# Patient Record
Sex: Female | Born: 1975 | Race: White | Hispanic: No | Marital: Married | Smoking: Former smoker
Health system: Southern US, Community
[De-identification: ages and names within clinical notes are randomized; demographics above are authoritative.]

## PROBLEM LIST (undated history)

## (undated) ENCOUNTER — Inpatient Hospital Stay (HOSPITAL_COMMUNITY): Payer: Self-pay

## (undated) DIAGNOSIS — R51 Headache: Secondary | ICD-10-CM

## (undated) DIAGNOSIS — M549 Dorsalgia, unspecified: Secondary | ICD-10-CM

## (undated) DIAGNOSIS — K219 Gastro-esophageal reflux disease without esophagitis: Secondary | ICD-10-CM

## (undated) DIAGNOSIS — R519 Headache, unspecified: Secondary | ICD-10-CM

## (undated) DIAGNOSIS — S0990XA Unspecified injury of head, initial encounter: Secondary | ICD-10-CM

## (undated) HISTORY — PX: COLPOSCOPY: SHX161

## (undated) HISTORY — PX: FACIAL RECONSTRUCTION SURGERY: SHX631

## (undated) HISTORY — DX: Headache, unspecified: R51.9

## (undated) HISTORY — DX: Headache: R51

## (undated) HISTORY — PX: WISDOM TOOTH EXTRACTION: SHX21

---

## 1997-12-23 ENCOUNTER — Emergency Department (HOSPITAL_COMMUNITY): Admission: EM | Admit: 1997-12-23 | Discharge: 1997-12-23 | Payer: Self-pay | Admitting: Emergency Medicine

## 1998-04-22 ENCOUNTER — Emergency Department (HOSPITAL_COMMUNITY): Admission: EM | Admit: 1998-04-22 | Discharge: 1998-04-22 | Payer: Self-pay | Admitting: Emergency Medicine

## 1998-10-05 ENCOUNTER — Emergency Department (HOSPITAL_COMMUNITY): Admission: EM | Admit: 1998-10-05 | Discharge: 1998-10-05 | Payer: Self-pay | Admitting: Emergency Medicine

## 1998-11-17 ENCOUNTER — Encounter: Admission: RE | Admit: 1998-11-17 | Discharge: 1998-11-17 | Payer: Self-pay | Admitting: Obstetrics

## 1999-02-07 ENCOUNTER — Encounter: Admission: RE | Admit: 1999-02-07 | Discharge: 1999-02-07 | Payer: Self-pay | Admitting: Obstetrics & Gynecology

## 1999-05-05 ENCOUNTER — Encounter: Admission: RE | Admit: 1999-05-05 | Discharge: 1999-05-05 | Payer: Self-pay | Admitting: Obstetrics & Gynecology

## 1999-07-25 ENCOUNTER — Encounter: Admission: RE | Admit: 1999-07-25 | Discharge: 1999-07-25 | Payer: Self-pay | Admitting: Obstetrics

## 2001-11-11 ENCOUNTER — Other Ambulatory Visit: Admission: RE | Admit: 2001-11-11 | Discharge: 2001-11-11 | Payer: Self-pay | Admitting: Obstetrics and Gynecology

## 2001-11-11 ENCOUNTER — Other Ambulatory Visit: Admission: RE | Admit: 2001-11-11 | Discharge: 2001-11-11 | Payer: Self-pay | Admitting: Obstetrics & Gynecology

## 2002-04-21 ENCOUNTER — Other Ambulatory Visit: Admission: RE | Admit: 2002-04-21 | Discharge: 2002-04-21 | Payer: Self-pay | Admitting: Obstetrics and Gynecology

## 2002-06-19 ENCOUNTER — Inpatient Hospital Stay (HOSPITAL_COMMUNITY): Admission: AD | Admit: 2002-06-19 | Discharge: 2002-06-22 | Payer: Self-pay | Admitting: Obstetrics and Gynecology

## 2002-06-23 ENCOUNTER — Encounter: Admission: RE | Admit: 2002-06-23 | Discharge: 2002-07-23 | Payer: Self-pay | Admitting: Obstetrics and Gynecology

## 2002-07-13 ENCOUNTER — Other Ambulatory Visit: Admission: RE | Admit: 2002-07-13 | Discharge: 2002-07-13 | Payer: Self-pay | Admitting: Obstetrics and Gynecology

## 2002-07-24 ENCOUNTER — Encounter: Admission: RE | Admit: 2002-07-24 | Discharge: 2002-08-23 | Payer: Self-pay | Admitting: Obstetrics and Gynecology

## 2002-10-09 ENCOUNTER — Other Ambulatory Visit: Admission: RE | Admit: 2002-10-09 | Discharge: 2002-10-09 | Payer: Self-pay | Admitting: Obstetrics and Gynecology

## 2003-06-17 ENCOUNTER — Other Ambulatory Visit: Admission: RE | Admit: 2003-06-17 | Discharge: 2003-06-17 | Payer: Self-pay | Admitting: Obstetrics and Gynecology

## 2004-06-23 ENCOUNTER — Other Ambulatory Visit: Admission: RE | Admit: 2004-06-23 | Discharge: 2004-06-23 | Payer: Self-pay | Admitting: Obstetrics and Gynecology

## 2009-11-16 ENCOUNTER — Encounter (INDEPENDENT_AMBULATORY_CARE_PROVIDER_SITE_OTHER): Payer: Self-pay | Admitting: *Deleted

## 2009-12-30 ENCOUNTER — Ambulatory Visit: Payer: Self-pay | Admitting: Gastroenterology

## 2009-12-30 ENCOUNTER — Encounter (INDEPENDENT_AMBULATORY_CARE_PROVIDER_SITE_OTHER): Payer: Self-pay | Admitting: *Deleted

## 2009-12-30 DIAGNOSIS — K921 Melena: Secondary | ICD-10-CM

## 2010-01-09 ENCOUNTER — Telehealth: Payer: Self-pay | Admitting: Gastroenterology

## 2010-02-27 ENCOUNTER — Encounter: Payer: Self-pay | Admitting: Gastroenterology

## 2010-02-27 ENCOUNTER — Ambulatory Visit
Admission: RE | Admit: 2010-02-27 | Discharge: 2010-02-27 | Payer: Self-pay | Source: Home / Self Care | Attending: Gastroenterology | Admitting: Gastroenterology

## 2010-03-21 NOTE — Letter (Signed)
Summary: New Patient letter  [facility]  [address] [place], [place] [address]   Phone: [phone]  Fax: [phone]       11/16/2009 MRN: [id number]  [facility] [place] [address] [place], [place]  [address]  Dear Ms. PRESTON,  Welcome to the Gastroenterology Division at [facility].    You are scheduled to see Dr. Christella Hartigan on 12/30/2009 at 9:00AM on the 3rd floor at [facility], [address]  We ask that you try to arrive at our office 15 minutes prior to your appointment time to allow for check-in.  We would like you to complete the enclosed self-administered evaluation form prior to your visit and bring it with you on the day of your appointment.  We will review it with you.  Also, please bring a complete list of all your medications or, if you prefer, bring the medication bottles and we will list them.  Please bring your insurance card so that we may make a copy of it.  If your insurance requires a referral to see a specialist, please bring your referral form from your primary care physician.  Co-payments are due at the time of your visit and may be paid by cash, check or credit card.     Your office visit will consist of a consult with your physician (includes a physical exam), any laboratory testing he/she may order, scheduling of any necessary diagnostic testing (e.g. x-ray, ultrasound, CT-scan), and scheduling of a procedure (e.g. Endoscopy, Colonoscopy) if required.  Please allow enough time on your schedule to allow for any/all of these possibilities.    If you cannot keep your appointment, please call [phone] to cancel or reschedule prior to your appointment date.  This allows [place] the opportunity to schedule an appointment for another patient in need of care.  If you do not cancel or reschedule by 5 p.m. the business day prior to your appointment date, you will be charged a $50.00 late cancellation/no-show fee.    Thank you for  choosing [facility] for your medical needs.  We appreciate the opportunity to care for you.  Please visit [place] at our website  to learn more about our practice.                     Sincerely,                                                             The Gastroenterology Division

## 2010-03-21 NOTE — Progress Notes (Signed)
Summary: prep concern  Phone Note Call from Patient Call back at Home Phone [phone] Call back at [phone]   Caller: Patient Call For: Dr Christella Hartigan Reason for Call: Talk to Nurse Complaint: Nausea/Vomiting/Diarrhea Summary of Call: would like a more affordable prep  Initial call taken by: Vallarie Mare,  January 09, 2010 12:57 PM  Follow-up for Phone Call        no answer when called back, left patient message to call me back. Follow-up by: Sherren Kerns RN,  January 09, 2010 1:52 PM  Additional Follow-up for Phone Call Additional follow up Details #1::        Spoke with patient offered $20 mail in rebate,she agreed. I will mail this to her,she understands. also patient wants the [facility] Sent at this time. Additional Follow-up by: Sherren Kerns RN,  January 10, 2010 8:45 AM    New/Updated Medications: MOVIPREP 100 GM  SOLR (PEG-KCL-NACL-NASULF-NA ASC-C) As per prep instructions. Prescriptions: MOVIPREP 100 GM  SOLR (PEG-KCL-NACL-NASULF-NA ASC-C) As per prep instructions.  #1 x 0   Entered by:   Sherren Kerns RN   Authorized by:   Rachael Fee MD   Signed by:   Sherren Kerns RN on 01/10/2010   Method used:   Electronically to        [facility] [id number]* (retail)       [address]       [place], [place]  [address]       Ph: [phone]       Fax: [phone]   RxID:   [id number]

## 2010-03-21 NOTE — Assessment & Plan Note (Signed)
History of Present Illness Primary GI MD: Rob Bunting MD Primary Provider: Alinda Sierras, MD Requesting Provider: Carrington Clamp, MD Chief Complaint: Pos heme stools seen on last physical exam. Mother with hx of colon caner. History of Present Illness:      very pleasant 35 year old woman who had routine gyne exam, she explained her family history of colon cancer and so sent in stool samples that were +FOBT.  her mother was diagnosed with colon cancer in her early 65's.  Caught early,  ?surgery, then a "chemo pill."  Mom also had breast cancer 2009, double mastectomy/chem/xrt.  When she was 7 years go, she was told she had ulcers in her stomach.   was put on meds as needed (??name).  no reall issues with her bowels unless she eats too much [place] food (causes cramping/diarrhea).  Weight is up 20 pounds in past year.           Current Medications (verified): 1)  Nuvaring 0.12-0.015 Mg/24hr Ring (Etonogestrel-Ethinyl Estradiol) .... As Directed 2)  Clarinex 5 Mg Tabs (Desloratadine) .... As Needed 3)  Lipozene 1500mg  .... One Tablet By Mouth Three Times A Day  Allergies (verified): 1)  ! Penicillin 2)  ! 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  All other review of systems was otherwise negative.   Vital Signs:  Patient profile:   35 year old female Height:      61.5 inches Weight:      220.50 pounds BMI:     41.14 Pulse rate:   88 /  minute Pulse rhythm:   regular BP sitting:   132 / 78  (right arm) Cuff size:   regular  Vitals Entered By: Christie Nottingham CMA Duncan Dull) (December 30, 2009 8:49 AM)  Physical Exam  Additional Exam:  Constitutional: Obese, otherwise generally well appearing Psychiatric: alert and oriented times 3 Eyes: extraocular movements intact Mouth: oropharynx moist, no lesions Neck: supple, no lymphadenopathy Cardiovascular: heart regular rate and rythm Lungs: CTA bilaterally Abdomen: soft, non-tender, non-distended, no obvious ascites, no peritoneal signs, normal bowel sounds Extremities: no lower extremity edema bilaterally Skin: no lesions on visible extremities    Impression & Recommendations:  Problem # 1:  FOBT positive, family history of colon cancer she is otherwise asymptomatic and we will proceed with full colonoscopy at her soonest convenience. She understands that given her father's history of colon cancer she will likely need colonoscopy every 5 years even if no polyps are found.  Problem # 2:  obesity we spent several minutes talking about her obesity, weight loss plans. It seems like she buys a lot of products that are sold in infomercials. I explained to her that the best, long-term we plan is a small reduction in her overall calorie intake that she can continue indefinitely.  Problem # 3:   smoker phone numbers, information given to help her quit  Patient Instructions: 1)  One of your biggest  health concerns is your smoking.  You should try your absolute best to stop.  If you need assistence, please contact your PCP or Smoking Cessation Class at [facility] [phone]) or [facility] Quit-Line (1-800-QUIT-NOW). 2)  You will be scheduled to have a colonoscopy. 3)  Slow gradual weight loss over many years (aim for one pound a month...over many years this will make a huge difference). 4)  A copy of this information will be sent to Dr. Henderson Cloud. 5)  The medication list was  reviewed and reconciled.  All changed / newly prescribed medications were explained.  A complete medication list was provided to the patient / caregiver.  Appended Document: Orders Update/movi    Clinical Lists Changes  Problems: Added new problem of BLOOD IN STOOL (ICD-578.1) Medications: Added new medication of MOVIPREP 100 GM  SOLR (PEG-KCL-NACL-NASULF-NA ASC-C) As per prep instructions. - Signed Rx of MOVIPREP 100 GM  SOLR (PEG-KCL-NACL-NASULF-NA ASC-C) As per prep instructions.;  #1 x 0;  Signed;  Entered by: Chales Abrahams CMA (AAMA);  Authorized by: Rachael Fee MD;  Method used: Electronically to [facility] [phone]*, [address] [place], [place]  [address], Ph: [phone] or [phone], Fax: [phone] Orders: Added new Test order of Colonoscopy (Colon) - Signed    Prescriptions: MOVIPREP 100 GM  SOLR (PEG-KCL-NACL-NASULF-NA ASC-C) As per prep instructions.  #1 x 0   Entered by:   Chales Abrahams CMA (AAMA)   Authorized by:   Rachael Fee MD   Signed by:   Chales Abrahams CMA (AAMA) on 12/30/2009   Method used:   [facility] [phone][facility] (retail)       [address]       [place], [place]  [address]       Ph: [phone] or [phone]       Fax: [phone]   RxID:   [id number]

## 2010-03-21 NOTE — Letter (Signed)
Summary: [facility] Instructions  [facility]  [address] [place], [place] [address]   Phone: [phone]  Fax: [phone]       UNNAMED Jennifer Miranda    [birth year]    MRN: [id number]        Procedure Day /Date:02/27/10 MON     Arrival Time:930 am     Procedure Time:1030 am     Location of Procedure:                    X  [facility] (4th Floor)                        PREPARATION FOR COLONOSCOPY WITH MOVIPREP   Starting 5 days prior to your procedure 02/22/2010 do not eat nuts, seeds, popcorn, corn, beans, peas,  salads, or any raw vegetables.  Do not take any fiber supplements (e.g. Metamucil, Citrucel, and Benefiber).  THE DAY BEFORE YOUR PROCEDURE         DATE: 02/26/10  DAY: SUN  1.  Drink clear liquids the entire day-NO SOLID FOOD  2.  Do not drink anything colored red or purple.  Avoid juices with pulp.  No orange juice.  3.  Drink at least 64 oz. (8 glasses) of fluid/clear liquids during the day to prevent dehydration and help the prep work efficiently.  CLEAR LIQUIDS INCLUDE: Water Jello Ice Popsicles Tea (sugar ok, no milk/cream) Powdered fruit flavored drinks Coffee (sugar ok, no milk/cream) Gatorade Juice: apple, white grape, white cranberry  Lemonade Clear bullion, consomm, broth Carbonated beverages (any kind) Strained chicken noodle soup Hard Candy                             4.  In the morning, mix first dose of MoviPrep solution:    Empty 1 Pouch A and 1 Pouch B into the disposable container    Add lukewarm drinking water to the top line of the container. Mix to dissolve    Refrigerate (mixed solution should be used within 24 hrs)  5.  Begin drinking the prep at 5:00 p.m. The MoviPrep container is divided by 4 marks.   Every 15 minutes drink the solution down to the next mark (approximately 8 oz) until the full liter is complete.   6.  Follow completed prep with 16 oz of clear liquid of your choice (Nothing red or purple).   Continue to drink clear liquids until bedtime.  7.  Before going to bed, mix second dose of MoviPrep solution:    Empty 1 Pouch A and 1 Pouch B into the disposable container    Add lukewarm drinking water to the top line of the container. Mix to dissolve    Refrigerate  THE DAY OF YOUR PROCEDURE      DATE: 02/27/10 DAY: MON  Beginning at 530 a.m. (5 hours before procedure):         1. Every 15 minutes, drink the solution down to the next mark (approx 8 oz) until the full liter is complete.  2. Follow completed prep with 16 oz. of clear liquid of your choice.    3. You may drink clear liquids until 830 am (2 HOURS BEFORE PROCEDURE).   MEDICATION INSTRUCTIONS  Unless otherwise instructed, you should take regular prescription medications with a small sip of water   as early as possible the morning of your procedure.  OTHER INSTRUCTIONS  You will need a responsible adult at least 33 years of age to accompany you and drive you home.   This person must remain in the waiting room during your procedure.  Wear loose fitting clothing that is easily removed.  Leave jewelry and other valuables at home.  However, you may wish to bring a book to read or  an iPod/MP3 player to listen to music as you wait for your procedure to start.  Remove all body piercing jewelry and leave at home.  Total time from sign-in until discharge is approximately 2-3 hours.  You should go home directly after your procedure and rest.  You can resume normal activities the  day after your procedure.  The day of your procedure you should not:   Drive   Make legal decisions   Operate machinery   Drink alcohol   Return to work  You will receive specific instructions about eating, activities and medications before you leave.    The above instructions have been reviewed and explained to me by   _______________________    I fully understand and can verbalize these instructions  _____________________________ Date _________

## 2010-03-23 NOTE — Procedures (Signed)
Summary: Colonoscopy  Patient: Jennifer Miranda Note: All result statuses are Final unless otherwise noted.  Tests: (1) Colonoscopy (COL)   COL Colonoscopy           DONE     [facility]     [address]     [place], [place]  [address]           COLONOSCOPY PROCEDURE REPORT           PATIENT:  Jennifer, Miranda  MR#:  [id number]     BIRTHDATE:  1975-06-01, 34 yrs. old  GENDER:  female     ENDOSCOPIST:  Rachael Fee, MD     REF. BY:  Carrington Clamp, M.D.     PROCEDURE DATE:  02/27/2010     PROCEDURE:  Diagnostic Colonoscopy     ASA CLASS:  Class II     INDICATIONS:  FOBT positive stool, mother had colon cancer (in her     74s)     MEDICATIONS:   Fentanyl 100 mcg IV, Versed 9 mg IV           DESCRIPTION OF PROCEDURE:   After the risks benefits and     alternatives of the procedure were thoroughly explained, informed     consent was obtained.  Digital rectal exam was performed and     revealed no rectal masses.   The LB PCF-H180AL [id number] endoscope     was introduced through the anus and advanced to the cecum, which     was identified by both the appendix and ileocecal valve, without     limitations.  The quality of the prep was good, using MoviPrep.     The instrument was then slowly withdrawn as the colon was fully     examined.     <<PROCEDUREIMAGES>>     FINDINGS:  A normal appearing cecum, ileocecal valve, and     appendiceal orifice were identified. The ascending, hepatic     flexure, transverse, splenic flexure, descending, sigmoid colon,     and rectum appeared unremarkable (see image1, image2, and image3).     Retroflexed views in the rectum revealed no abnormalities.    The     scope was then withdrawn from the patient and the procedure     completed.     COMPLICATIONS:  None           ENDOSCOPIC IMPRESSION:     1) Normal colon     2) No polyps or cancers           RECOMMENDATIONS:     1) Given your significant family history of colon cancer, you   should have a repeat colonoscopy in 5 years           REPEAT EXAM:  5 years           ______________________________     Rachael Fee, MD           n.     eSIGNED:   Rachael Fee at 02/27/2010 10:54 AM           Eustace Quail, [id number]  Note: An exclamation mark (!) indicates a result that was not dispersed into the flowsheet. Document Creation Date: 02/27/2010 10:55 AM _______________________________________________________________________  (1) Order result status: Final Collection or observation date-time: 02/27/2010 10:49 Requested date-time:  Receipt date-time:  Reported date-time:  Referring Physician:   Ordering Physician: Rob Bunting [phone]) Specimen Source:  Source: Launa Grill Order Number: [id number]  Lab site:   Appended Document: Colonoscopy    Clinical Lists Changes  Observations: Added new observation of COLONNXTDUE: 02/2015 (02/27/2010 11:36)

## 2010-11-16 ENCOUNTER — Emergency Department (HOSPITAL_COMMUNITY)
Admission: EM | Admit: 2010-11-16 | Discharge: 2010-11-16 | Disposition: A | Discharge status: Home / Self Care | Payer: 59 | Attending: Emergency Medicine | Admitting: Emergency Medicine

## 2010-11-16 DIAGNOSIS — H539 Unspecified visual disturbance: Secondary | ICD-10-CM | POA: Insufficient documentation

## 2010-11-16 DIAGNOSIS — R112 Nausea with vomiting, unspecified: Secondary | ICD-10-CM | POA: Insufficient documentation

## 2015-05-12 ENCOUNTER — Encounter: Payer: Self-pay | Admitting: Gastroenterology

## 2015-06-20 ENCOUNTER — Encounter: Payer: Self-pay | Admitting: Gastroenterology

## 2017-02-19 NOTE — L&D Delivery Note (Signed)
Operative Delivery Note Patient pushed well for 20 minutes.  At 7:34 PM a viable female was delivered via Vaginal, Spontaneous.  Presentation: vertex; Position: direct OA.  After delivery of the fetal head, the fetal head did not restitute.  Nuchal cord x 1 was reduced.  Anterior left shoulder did not delivery easily with usual gentle traction.  Shoulder dystocia was identified and additional help requested.   Mom was instructed to stop pushing.  McRoberts/ suprapubic pressure applied.  Posterior arm attempted but unable to be delivered.  Woodscrew maneuver was employed, which allowed the anterior should to then deliver with the usual traction.  Total time for shoulder dystocia was 90 seconds.  Following delivery, the cord was immediately cut and clamped and passed to the warm for the neonatology team.  After evaluation by NICU team, baby was doing skin to skin with mom  Delivery of the head: 02/10/2018  7:32 PM First maneuver: 02/10/2018  7:32 PM, McRoberts Suprapubic Pressure Second maneuver: 02/10/2018  7:33 PM, Attempted posterior arm, unable to deliver  Third maneuver: 02/10/2018  7:34 PM,  Woodscrew   APGAR: 2, 9; weight 9lb 1.7oz (4130gm)   Placenta status: spontaneous, in tact.   Cord: 3V with the following complications:None .  Cord pH: 7.16  Anesthesia:  Epidural Episiotomy: None Lacerations: 2nd degree Suture Repair: 2.0 vicryl rapide Est. Blood Loss (mL):  300 mL  Mom to postpartum.  Baby to Couplet care / Skin to Skin.  Jennifer [name] 02/10/2018, 8:08 PM

## 2017-07-30 LAB — OB RESULTS CONSOLE GC/CHLAMYDIA
CHLAMYDIA, DNA PROBE: NEGATIVE
GC PROBE AMP, GENITAL: NEGATIVE

## 2017-07-30 LAB — OB RESULTS CONSOLE RUBELLA ANTIBODY, IGM: Rubella: IMMUNE

## 2017-07-30 LAB — OB RESULTS CONSOLE HIV ANTIBODY (ROUTINE TESTING): HIV: NONREACTIVE

## 2017-07-30 LAB — OB RESULTS CONSOLE RPR: RPR: NONREACTIVE

## 2017-07-30 LAB — OB RESULTS CONSOLE HEPATITIS B SURFACE ANTIGEN: Hepatitis B Surface Ag: NEGATIVE

## 2018-01-14 ENCOUNTER — Encounter (HOSPITAL_COMMUNITY): Payer: Self-pay | Admitting: *Deleted

## 2018-01-14 ENCOUNTER — Inpatient Hospital Stay (HOSPITAL_COMMUNITY)
Admission: AD | Admit: 2018-01-14 | Discharge: 2018-01-14 | Disposition: A | Discharge status: Home / Self Care | Payer: 59 | Source: Ambulatory Visit | Attending: Obstetrics and Gynecology | Admitting: Obstetrics and Gynecology

## 2018-01-14 DIAGNOSIS — Z3A35 35 weeks gestation of pregnancy: Secondary | ICD-10-CM | POA: Insufficient documentation

## 2018-01-14 DIAGNOSIS — M545 Low back pain: Secondary | ICD-10-CM | POA: Insufficient documentation

## 2018-01-14 DIAGNOSIS — Z91040 Latex allergy status: Secondary | ICD-10-CM | POA: Insufficient documentation

## 2018-01-14 DIAGNOSIS — O09523 Supervision of elderly multigravida, third trimester: Secondary | ICD-10-CM | POA: Diagnosis not present

## 2018-01-14 DIAGNOSIS — Z880 Allergy status to penicillin: Secondary | ICD-10-CM | POA: Insufficient documentation

## 2018-01-14 DIAGNOSIS — M549 Dorsalgia, unspecified: Secondary | ICD-10-CM | POA: Diagnosis not present

## 2018-01-14 DIAGNOSIS — O99891 Other specified diseases and conditions complicating pregnancy: Secondary | ICD-10-CM

## 2018-01-14 DIAGNOSIS — O26893 Other specified pregnancy related conditions, third trimester: Secondary | ICD-10-CM | POA: Diagnosis not present

## 2018-01-14 DIAGNOSIS — O9989 Other specified diseases and conditions complicating pregnancy, childbirth and the puerperium: Secondary | ICD-10-CM

## 2018-01-14 DIAGNOSIS — R109 Unspecified abdominal pain: Secondary | ICD-10-CM | POA: Insufficient documentation

## 2018-01-14 HISTORY — DX: Unspecified injury of head, initial encounter: S09.90XA

## 2018-01-14 LAB — OB RESULTS CONSOLE GC/CHLAMYDIA: Gonorrhea: NEGATIVE

## 2018-01-14 LAB — URINALYSIS, ROUTINE W REFLEX MICROSCOPIC
Bilirubin Urine: NEGATIVE
GLUCOSE, UA: NEGATIVE mg/dL
Hgb urine dipstick: NEGATIVE
Ketones, ur: NEGATIVE mg/dL
Nitrite: NEGATIVE
PH: 6 (ref 5.0–8.0)
Protein, ur: NEGATIVE mg/dL
Specific Gravity, Urine: 1.004 — ABNORMAL LOW (ref 1.005–1.030)

## 2018-01-14 LAB — WET PREP, GENITAL
CLUE CELLS WET PREP: NONE SEEN
SPERM: NONE SEEN
TRICH WET PREP: NONE SEEN
Yeast Wet Prep HPF POC: NONE SEEN

## 2018-01-14 MED ORDER — LACTATED RINGERS IV BOLUS
1000.0000 mL | Freq: Once | INTRAVENOUS | Status: AC
  Administered 2018-01-14: 1000 mL via INTRAVENOUS

## 2018-01-14 MED ORDER — CYCLOBENZAPRINE HCL 10 MG PO TABS
10.0000 mg | ORAL_TABLET | Freq: Once | ORAL | Status: AC
  Administered 2018-01-14: 10 mg via ORAL
  Filled 2018-01-14: qty 1

## 2018-01-14 MED ORDER — CYCLOBENZAPRINE HCL 10 MG PO TABS: 10.0000 mg | ORAL_TABLET | Freq: Two times a day (BID) | ORAL | 0 refills | Status: DC | PRN

## 2018-01-14 NOTE — Discharge Instructions (Signed)

## 2018-01-14 NOTE — MAU Provider Note (Signed)
History     CSN: [id number]  Arrival date and time: 01/14/18 1444   First Provider Initiated Contact with Patient 01/14/18 1534      Chief Complaint  Patient presents with  . Abdominal Pain  . Back Pain   Jennifer Miranda is a 42 y.o. G2P1001 at [redacted]w[redacted]d who presents today with back pain, abdominal pain and watery discharge. She states that the back pain started when she woke up today. The abdominal pain started around 1245 this afternoon and she has had the watery discharge off and on since about 1000 today. She reports normal fetal movement. Next visit 01/21/18   Abdominal Pain  This is a new problem. The current episode started today. The problem occurs intermittently. The problem has been unchanged. The pain is located in the periumbilical region. The pain is at a severity of 6/10. The abdominal pain does not radiate. Associated symptoms include frequency. Pertinent negatives include no dysuria, fever, nausea or vomiting. Nothing aggravates the pain. The pain is relieved by nothing.  Back Pain  This is a new problem. The current episode started today. The problem occurs constantly. The problem is unchanged. The pain is present in the lumbar spine. The pain does not radiate. The pain is at a severity of 6/10. Associated symptoms include abdominal pain. Pertinent negatives include no dysuria or fever. Risk factors include pregnancy and obesity. She has tried nothing for the symptoms.    OB History    Gravida  2   Para  1   Term  1   Preterm      AB      Living  1     SAB      TAB      Ectopic      Multiple      Live Births              Past Medical History:  Diagnosis Date  . Head trauma   . Vaginal Pap smear, abnormal     Past Surgical History:  Procedure Laterality Date  . COLPOSCOPY    . WISDOM TOOTH EXTRACTION      No family history on file.  Social History   Tobacco Use  . Smoking status: Never Smoker  . Smokeless tobacco: Never Used  Substance  Use Topics  . Alcohol use: Never    Frequency: Never  . Drug use: Never    Allergies:  Allergies  Allergen Reactions  . Latex   . Penicillins     No medications prior to admission.    Review of Systems  Constitutional: Negative for chills and fever.  Gastrointestinal: Positive for abdominal pain. Negative for nausea and vomiting.  Genitourinary: Positive for frequency. Negative for dysuria, urgency, vaginal bleeding and vaginal discharge.  Musculoskeletal: Positive for back pain.   Physical Exam   Blood pressure 123/83, pulse (!) 108, temperature 98.2 F (36.8 C), temperature source Oral, resp. rate 17, height 5\' 2"  (1.575 m), weight 112.9 kg, SpO2 100 %.  Physical Exam  Nursing note and vitals reviewed. Constitutional: She is oriented to person, place, and time. She appears well-developed and well-nourished. No distress.  HENT:  Head: Normocephalic.  Cardiovascular: Normal rate.  Respiratory: Effort normal.  GI: Soft. There is no tenderness. There is no rebound.  Genitourinary:  Genitourinary Comments:  External: no lesion Vagina: small amount of white discharge, no pooling  Cervix: pink, smooth, closed/thick Uterus: AGA   Neurological: She is alert and oriented to person, place,  and time.  Skin: Skin is warm and dry.  Psychiatric: She has a normal mood and affect.     Results for orders placed or performed during the hospital encounter of 01/14/18 (from the past 24 hour(s))  Urinalysis, Routine w reflex microscopic     Status: Abnormal   Collection Time: 01/14/18  3:28 PM  Result Value Ref Range   Color, Urine STRAW (A) YELLOW   APPearance CLEAR CLEAR   Specific Gravity, Urine 1.004 (L) 1.005 - 1.030   pH 6.0 5.0 - 8.0   Glucose, UA NEGATIVE NEGATIVE mg/dL   Hgb urine dipstick NEGATIVE NEGATIVE   Bilirubin Urine NEGATIVE NEGATIVE   Ketones, ur NEGATIVE NEGATIVE mg/dL   Protein, ur NEGATIVE NEGATIVE mg/dL   Nitrite NEGATIVE NEGATIVE   Leukocytes, UA  TRACE (A) NEGATIVE   RBC / HPF 0-5 0 - 5 RBC/hpf   WBC, UA 0-5 0 - 5 WBC/hpf   Bacteria, UA MANY (A) NONE SEEN   Squamous Epithelial / LPF 0-5 0 - 5  Wet prep, genital     Status: Abnormal   Collection Time: 01/14/18  3:50 PM  Result Value Ref Range   Yeast Wet Prep HPF POC NONE SEEN NONE SEEN   Trich, Wet Prep NONE SEEN NONE SEEN   Clue Cells Wet Prep HPF POC NONE SEEN NONE SEEN   WBC, Wet Prep HPF POC MODERATE (A) NONE SEEN   Sperm NONE SEEN    NST:  Baseline: 135 Variability: moderate Accels: 15x15 Decels: none Toco: irregular    MAU Course  Procedures  MDM Patient given LR bolus and Flexeril. She reports that her pain has improved.   Assessment and Plan   1. Back pain complicating pregnancy in third trimester   2. [redacted] weeks gestation of pregnancy    DC home Urine culture pending GC/CT pending  Comfort measures reviewed  3rd Trimester precautions  PTL precautions  Fetal kick counts RX: flexeril PRN #20  Return to MAU as needed FU with OB as planned  Follow-up Information    [facility]   Contact information: [address] [place] [address] [phone]            Thressa ShellerHeather Hogan 01/14/2018, 3:38 PM

## 2018-01-14 NOTE — MAU Note (Signed)
Pt reports she has had lower back pain all day, pain is worsening and now she is having pain on right side . Also noticed a watery discharge this afternoon.

## 2018-01-15 LAB — GC/CHLAMYDIA PROBE AMP (~~LOC~~) NOT AT ARMC
CHLAMYDIA, DNA PROBE: NEGATIVE
NEISSERIA GONORRHEA: NEGATIVE

## 2018-01-16 LAB — CULTURE, OB URINE: CULTURE: NO GROWTH

## 2018-01-22 LAB — OB RESULTS CONSOLE GBS: GBS: POSITIVE

## 2018-01-30 ENCOUNTER — Telehealth (HOSPITAL_COMMUNITY): Payer: Self-pay | Admitting: *Deleted

## 2018-01-30 ENCOUNTER — Encounter (HOSPITAL_COMMUNITY): Payer: Self-pay | Admitting: *Deleted

## 2018-01-30 NOTE — Telephone Encounter (Signed)
Preadmission screen  

## 2018-02-10 ENCOUNTER — Encounter (HOSPITAL_COMMUNITY): Payer: Self-pay

## 2018-02-10 ENCOUNTER — Other Ambulatory Visit: Payer: Self-pay

## 2018-02-10 ENCOUNTER — Inpatient Hospital Stay (HOSPITAL_COMMUNITY)
Admission: RE | Admit: 2018-02-10 | Discharge: 2018-02-12 | DRG: 807 | Disposition: A | Discharge status: Home / Self Care | Payer: 59 | Attending: Obstetrics | Admitting: Obstetrics

## 2018-02-10 ENCOUNTER — Inpatient Hospital Stay (HOSPITAL_COMMUNITY): Payer: 59 | Admitting: Anesthesiology

## 2018-02-10 DIAGNOSIS — O26893 Other specified pregnancy related conditions, third trimester: Principal | ICD-10-CM | POA: Diagnosis present

## 2018-02-10 DIAGNOSIS — O09523 Supervision of elderly multigravida, third trimester: Secondary | ICD-10-CM | POA: Diagnosis present

## 2018-02-10 DIAGNOSIS — Z880 Allergy status to penicillin: Secondary | ICD-10-CM

## 2018-02-10 DIAGNOSIS — Z3A39 39 weeks gestation of pregnancy: Secondary | ICD-10-CM | POA: Diagnosis not present

## 2018-02-10 DIAGNOSIS — O99824 Streptococcus B carrier state complicating childbirth: Secondary | ICD-10-CM | POA: Diagnosis present

## 2018-02-10 LAB — CBC
HCT: 34.6 % — ABNORMAL LOW (ref 36.0–46.0)
Hemoglobin: 11 g/dL — ABNORMAL LOW (ref 12.0–15.0)
MCH: 28.1 pg (ref 26.0–34.0)
MCHC: 31.8 g/dL (ref 30.0–36.0)
MCV: 88.3 fL (ref 80.0–100.0)
PLATELETS: 229 10*3/uL (ref 150–400)
RBC: 3.92 MIL/uL (ref 3.87–5.11)
RDW: 14.8 % (ref 11.5–15.5)
WBC: 10.5 10*3/uL (ref 4.0–10.5)
nRBC: 0 % (ref 0.0–0.2)

## 2018-02-10 LAB — ABO/RH: ABO/RH(D): O POS

## 2018-02-10 LAB — TYPE AND SCREEN
ABO/RH(D): O POS
Antibody Screen: NEGATIVE

## 2018-02-10 LAB — RPR: RPR Ser Ql: NONREACTIVE

## 2018-02-10 MED ORDER — PRENATAL MULTIVITAMIN CH
1.0000 | ORAL_TABLET | Freq: Every day | ORAL | Status: DC
  Administered 2018-02-11: 1 via ORAL
  Filled 2018-02-10: qty 1

## 2018-02-10 MED ORDER — TETANUS-DIPHTH-ACELL PERTUSSIS 5-2.5-18.5 LF-MCG/0.5 IM SUSP: 0.5000 mL | Freq: Once | INTRAMUSCULAR | Status: DC

## 2018-02-10 MED ORDER — PHENYLEPHRINE 40 MCG/ML (10ML) SYRINGE FOR IV PUSH (FOR BLOOD PRESSURE SUPPORT)
80.0000 ug | PREFILLED_SYRINGE | INTRAVENOUS | Status: DC | PRN
  Filled 2018-02-10: qty 10

## 2018-02-10 MED ORDER — FAMOTIDINE 20 MG PO TABS
20.0000 mg | ORAL_TABLET | Freq: Every day | ORAL | Status: DC
  Administered 2018-02-11 – 2018-02-12 (×2): 20 mg via ORAL
  Filled 2018-02-10 (×2): qty 1

## 2018-02-10 MED ORDER — OXYCODONE HCL 5 MG PO TABS: 10.0000 mg | ORAL_TABLET | ORAL | Status: DC | PRN

## 2018-02-10 MED ORDER — DIPHENHYDRAMINE HCL 50 MG/ML IJ SOLN: 12.5000 mg | INTRAMUSCULAR | Status: DC | PRN

## 2018-02-10 MED ORDER — ACETAMINOPHEN 325 MG PO TABS: 650.0000 mg | ORAL_TABLET | ORAL | Status: DC | PRN

## 2018-02-10 MED ORDER — OXYCODONE HCL 5 MG PO TABS: 5.0000 mg | ORAL_TABLET | ORAL | Status: DC | PRN

## 2018-02-10 MED ORDER — FENTANYL 2.5 MCG/ML BUPIVACAINE 1/10 % EPIDURAL INFUSION (WH - ANES)
14.0000 mL/h | INTRAMUSCULAR | Status: DC | PRN
  Administered 2018-02-10 (×2): 14 mL/h via EPIDURAL
  Filled 2018-02-10 (×2): qty 100

## 2018-02-10 MED ORDER — IBUPROFEN 600 MG PO TABS
600.0000 mg | ORAL_TABLET | Freq: Four times a day (QID) | ORAL | Status: DC
  Administered 2018-02-10 – 2018-02-12 (×6): 600 mg via ORAL
  Filled 2018-02-10 (×6): qty 1

## 2018-02-10 MED ORDER — PHENYLEPHRINE 40 MCG/ML (10ML) SYRINGE FOR IV PUSH (FOR BLOOD PRESSURE SUPPORT)
80.0000 ug | PREFILLED_SYRINGE | INTRAVENOUS | Status: DC | PRN
  Filled 2018-02-10 (×2): qty 10

## 2018-02-10 MED ORDER — DIBUCAINE 1 % RE OINT: 1.0000 "application " | TOPICAL_OINTMENT | RECTAL | Status: DC | PRN

## 2018-02-10 MED ORDER — TERBUTALINE SULFATE 1 MG/ML IJ SOLN
0.2500 mg | Freq: Once | INTRAMUSCULAR | Status: DC | PRN
  Filled 2018-02-10: qty 1

## 2018-02-10 MED ORDER — DIPHENHYDRAMINE HCL 25 MG PO CAPS: 25.0000 mg | ORAL_CAPSULE | Freq: Four times a day (QID) | ORAL | Status: DC | PRN

## 2018-02-10 MED ORDER — ONDANSETRON HCL 4 MG/2ML IJ SOLN
4.0000 mg | Freq: Four times a day (QID) | INTRAMUSCULAR | Status: DC | PRN
  Administered 2018-02-10: 4 mg via INTRAVENOUS
  Filled 2018-02-10: qty 2

## 2018-02-10 MED ORDER — COCONUT OIL OIL
1.0000 "application " | TOPICAL_OIL | Status: DC | PRN
  Filled 2018-02-10: qty 120

## 2018-02-10 MED ORDER — BENZOCAINE-MENTHOL 20-0.5 % EX AERO
1.0000 "application " | INHALATION_SPRAY | CUTANEOUS | Status: DC | PRN
  Administered 2018-02-12: 1 via TOPICAL
  Filled 2018-02-10: qty 56

## 2018-02-10 MED ORDER — LIDOCAINE HCL (PF) 1 % IJ SOLN
INTRAMUSCULAR | Status: DC | PRN
  Administered 2018-02-10: 13 mL via EPIDURAL

## 2018-02-10 MED ORDER — EPHEDRINE 5 MG/ML INJ
10.0000 mg | INTRAVENOUS | Status: DC | PRN
  Filled 2018-02-10: qty 2

## 2018-02-10 MED ORDER — LACTATED RINGERS IV SOLN
500.0000 mL | Freq: Once | INTRAVENOUS | Status: AC
  Administered 2018-02-10: 500 mL via INTRAVENOUS

## 2018-02-10 MED ORDER — LORATADINE 10 MG PO TABS
10.0000 mg | ORAL_TABLET | Freq: Every day | ORAL | Status: DC
  Administered 2018-02-11 – 2018-02-12 (×2): 10 mg via ORAL
  Filled 2018-02-10 (×2): qty 1

## 2018-02-10 MED ORDER — OXYTOCIN 40 UNITS IN LACTATED RINGERS INFUSION - SIMPLE MED
1.0000 m[IU]/min | INTRAVENOUS | Status: DC
  Administered 2018-02-10: 2 m[IU]/min via INTRAVENOUS
  Filled 2018-02-10: qty 1000

## 2018-02-10 MED ORDER — OXYTOCIN 40 UNITS IN LACTATED RINGERS INFUSION - SIMPLE MED: 2.5000 [IU]/h | INTRAVENOUS | Status: DC

## 2018-02-10 MED ORDER — LACTATED RINGERS IV SOLN
INTRAVENOUS | Status: DC
  Administered 2018-02-10 (×2): via INTRAVENOUS

## 2018-02-10 MED ORDER — OXYCODONE-ACETAMINOPHEN 5-325 MG PO TABS: 1.0000 | ORAL_TABLET | ORAL | Status: DC | PRN

## 2018-02-10 MED ORDER — FENTANYL CITRATE (PF) 100 MCG/2ML IJ SOLN: 50.0000 ug | INTRAMUSCULAR | Status: DC | PRN

## 2018-02-10 MED ORDER — ONDANSETRON HCL 4 MG PO TABS: 4.0000 mg | ORAL_TABLET | ORAL | Status: DC | PRN

## 2018-02-10 MED ORDER — OXYCODONE-ACETAMINOPHEN 5-325 MG PO TABS: 2.0000 | ORAL_TABLET | ORAL | Status: DC | PRN

## 2018-02-10 MED ORDER — OXYTOCIN BOLUS FROM INFUSION
500.0000 mL | Freq: Once | INTRAVENOUS | Status: AC
  Administered 2018-02-10: 500 mL via INTRAVENOUS

## 2018-02-10 MED ORDER — SOD CITRATE-CITRIC ACID 500-334 MG/5ML PO SOLN
30.0000 mL | ORAL | Status: DC | PRN
  Administered 2018-02-10: 30 mL via ORAL
  Filled 2018-02-10: qty 15

## 2018-02-10 MED ORDER — SIMETHICONE 80 MG PO CHEW: 80.0000 mg | CHEWABLE_TABLET | ORAL | Status: DC | PRN

## 2018-02-10 MED ORDER — LIDOCAINE HCL (PF) 1 % IJ SOLN
30.0000 mL | INTRAMUSCULAR | Status: DC | PRN
  Filled 2018-02-10: qty 30

## 2018-02-10 MED ORDER — WITCH HAZEL-GLYCERIN EX PADS: 1.0000 "application " | MEDICATED_PAD | CUTANEOUS | Status: DC | PRN

## 2018-02-10 MED ORDER — SENNOSIDES-DOCUSATE SODIUM 8.6-50 MG PO TABS
2.0000 | ORAL_TABLET | ORAL | Status: DC
  Administered 2018-02-10 – 2018-02-11 (×2): 2 via ORAL
  Filled 2018-02-10 (×2): qty 2

## 2018-02-10 MED ORDER — ONDANSETRON HCL 4 MG/2ML IJ SOLN: 4.0000 mg | INTRAMUSCULAR | Status: DC | PRN

## 2018-02-10 MED ORDER — VANCOMYCIN HCL IN DEXTROSE 1-5 GM/200ML-% IV SOLN
1000.0000 mg | Freq: Two times a day (BID) | INTRAVENOUS | Status: DC
  Administered 2018-02-10: 1000 mg via INTRAVENOUS
  Filled 2018-02-10 (×2): qty 200

## 2018-02-10 MED ORDER — LACTATED RINGERS IV SOLN
500.0000 mL | INTRAVENOUS | Status: DC | PRN
  Administered 2018-02-10: 500 mL via INTRAVENOUS

## 2018-02-10 NOTE — Consult Note (Signed)
Neonatology Note:  Attendance at Code Apgar:   Our team responded to a Code Apgar call to room # 174 following NSVD, due to infant with apnea following 90-second shoulder dystocia. The requesting physician was Dr. Chestine Sporelark. The mother is a G2P1 O pos, GBS positive with AMA, IVF pregnancy, IOL for AMA. ROM occurred 8 hours PTD and the fluid was clear. Mother was treated with Vancomycin about 12 hours before delivery and was afebrile. At delivery, the baby was flaccid, pale, mottled, blue, and apneic, but with a HR of about 100. Our team arrived just as the baby was placed on the radiant warmer. I bulb suctioned for some mucous, then applied PPV. HR remained steady and rose quickly; color gradually improved, also. By about 1.5 minutes, the baby began to breathe and PPV was stopped. Breath sounds were clear with good air movement. Over the next 2-3 minutes, the baby became more vigorous, regaining normal muscle tone, color, and perfusion. O2 saturations were 95% in room air. Ap 2/9. PE remarkable for facial bruising, no palpable clavicle fracture, and good movement of both arms. HR was 190-200 (recovery phase) and I informed supervising nurse that HR should normalize within 20-30 minutes. I spoke with the parents in the DR, then transferred the baby to the Pediatrician's care.   Doretha Souhristie C. Jaeleen Inzunza, MD

## 2018-02-10 NOTE — Progress Notes (Signed)
Pt comfortable with epidural  BP 120/73   Pulse (!) 114   Temp (!) 97.5 F (36.4 C) (Oral)   Resp 18   Ht 5' 1.5" (1.562 m)   Wt 117.1 kg   SpO2 100%   BMI 47.98 kg/m   Toco: q2-5 minutes EFM: 130s, moderate variability, + accelerations SVE: 4/50/-2, AROm clear fluid  A&P: G2P1 @ 4961w2d with IOL for AMA Continue pitocin FSR/vtx/gbs + on vanc

## 2018-02-10 NOTE — Anesthesia Pain Management Evaluation Note (Signed)
  CRNA Pain Management Visit Note  Patient: Augusto Gambleeresa R Soucek, 42 y.o., female  "Hello I am a member of the anesthesia team at [facility]. We have an anesthesia team available at all times to provide care throughout the hospital, including epidural management and anesthesia for C-section. I don't know your plan for the delivery whether it a natural birth, water birth, IV sedation, nitrous supplementation, doula or epidural, but we want to meet your pain goals."   1.Was your pain managed to your expectations on prior hospitalizations?   Yes   2.What is your expectation for pain management during this hospitalization?     Epidural  3.How can we help you reach that goal? Manage epidural as needed  Record the patient's initial score and the patient's pain goal.   Pain: 0  Pain Goal: 4 [facility] you to be able to say your pain was always managed very well.  Cleda ClarksBrowder, Astor Gentle R 02/10/2018

## 2018-02-10 NOTE — Anesthesia Procedure Notes (Signed)
Epidural Patient location during procedure: OB Start time: 02/10/2018 11:06 AM End time: 02/10/2018 11:19 AM  Staffing Anesthesiologist: Lowella CurbMiller, Kalen Neidert Ray, MD Performed: anesthesiologist   Preanesthetic Checklist Completed: patient identified, site marked, surgical consent, pre-op evaluation, timeout performed, IV checked, risks and benefits discussed and monitors and equipment checked  Epidural Patient position: sitting Prep: ChloraPrep Patient monitoring: heart rate, cardiac monitor, continuous pulse ox and blood pressure Approach: midline Location: L2-L3 Injection technique: LOR saline  Needle:  Needle type: Tuohy  Needle gauge: 17 G Needle length: 9 cm Needle insertion depth: 7 cm Catheter type: closed end flexible Catheter size: 20 Guage Catheter at skin depth: 12 cm Test dose: negative  Assessment Events: blood not aspirated, injection not painful, no injection resistance, negative IV test and no paresthesia  Additional Notes Reason for block:procedure for pain

## 2018-02-10 NOTE — Anesthesia Preprocedure Evaluation (Signed)
Anesthesia Evaluation  Patient identified by MRN, date of birth, ID band Patient awake    Reviewed: Allergy & Precautions, NPO status , Patient's Chart, lab work & pertinent test results  Airway Mallampati: II  TM Distance: >3 FB Neck ROM: Full    Dental no notable dental hx.    Pulmonary neg pulmonary ROS,    Pulmonary exam normal breath sounds clear to auscultation       Cardiovascular negative cardio ROS Normal cardiovascular exam Rhythm:Regular Rate:Normal     Neuro/Psych  Headaches, negative psych ROS   GI/Hepatic negative GI ROS, Neg liver ROS,   Endo/Other  Morbid obesity  Renal/GU negative Renal ROS  negative genitourinary   Musculoskeletal negative musculoskeletal ROS (+)   Abdominal (+) + obese,   Peds negative pediatric ROS (+)  Hematology negative hematology ROS (+)   Anesthesia Other Findings   Reproductive/Obstetrics (+) Pregnancy                             Anesthesia Physical Anesthesia Plan  ASA: III  Anesthesia Plan: Epidural   Post-op Pain Management:    Induction:   PONV Risk Score and Plan:   Airway Management Planned:   Additional Equipment:   Intra-op Plan:   Post-operative Plan:   Informed Consent:   Plan Discussed with:   Anesthesia Plan Comments:         Anesthesia Quick Evaluation

## 2018-02-10 NOTE — H&P (Signed)
42 y.o. G2P1001 @ 1364w2d presents for IOL for advanced maternal age.  Otherwise has good fetal movement and no bleeding.  Pregnancy c/b: 1. Advanced maternal age.  Had IVF with PGD   Past Medical History:  Diagnosis Date  . Head trauma   . Headache   . Newborn product of in vitro fertilization (IVF) pregnancy     Past Surgical History:  Procedure Laterality Date  . COLPOSCOPY    . FACIAL RECONSTRUCTION SURGERY    . WISDOM TOOTH EXTRACTION      OB History  Gravida Para Term Preterm AB Living  2 1 1     1   SAB TAB Ectopic Multiple Live Births          1    # Outcome Date GA Lbr Len/2nd Weight Sex Delivery Anes PTL Lv  2 Current           1 Term 2004 2783w0d  3997 g M   N LIV    Social History   Socioeconomic History  . Marital status: Married    Spouse name: Not on file  . Number of children: Not on file  . Years of education: Not on file  . Highest education level: Not on file  Occupational History  . Not on file  Social Needs  . Financial resource strain: Not hard at all  . Food insecurity:    Worry: Never true    Inability: Never true  . Transportation needs:    Medical: No    Non-medical: Not on file  Tobacco Use  . Smoking status: Never Smoker  . Smokeless tobacco: Never Used  Substance and Sexual Activity  . Alcohol use: Never    Frequency: Never  . Drug use: Never  . Sexual activity: Yes  Lifestyle  . Physical activity:    Days per week: Not on file    Minutes per session: Not on file  . Stress: Only a little   Penicillins and Latex    Prenatal Transfer Tool  Maternal Diabetes: No Genetic Screening: Normal--PGD Maternal Ultrasounds/Referrals: Normal Fetal Ultrasounds or other Referrals:  None Maternal Substance Abuse:  No Significant Maternal Medications:  Meds include: Other:  claritin, pepcid Significant Maternal Lab Results: Lab values include: Group B Strep positive  ABO, Rh:  O+ Antibody:  pending Rubella: Immune (06/11 0000) RPR:  Nonreactive (06/11 0000)  HBsAg: Negative (06/11 0000)  HIV: Non-reactive (06/11 0000)  GBS: Positive (12/04 0000)     Vitals:   02/10/18 0907 02/10/18 0934  BP: 138/80 132/83  Pulse: 90 83  Resp:    Temp:       General:  NAD Abdomen:  soft, gravid, EFW 9# Ex:  1+ edema SVE:  3/50/-2 FHTs:  130s, mod var, + accels, no decels, Cat 1 Toco:  Rare contraction   A/P   42 y.o. G2P1001 6864w2d presents for IOL for AMA Cervix favorable, will start pitocin GBS positive, resistant to clindamycin with severe PCN allergy (anaphylaxis)--will use vanc PP 8lb 13oz with <12 hr labor FSR/ vtx/ GBS positive  Zackari Ruane GEFFEL Al Bracewell

## 2018-02-11 LAB — CBC
HCT: 32.8 % — ABNORMAL LOW (ref 36.0–46.0)
Hemoglobin: 10.4 g/dL — ABNORMAL LOW (ref 12.0–15.0)
MCH: 27.9 pg (ref 26.0–34.0)
MCHC: 31.7 g/dL (ref 30.0–36.0)
MCV: 87.9 fL (ref 80.0–100.0)
Platelets: 212 10*3/uL (ref 150–400)
RBC: 3.73 MIL/uL — ABNORMAL LOW (ref 3.87–5.11)
RDW: 14.6 % (ref 11.5–15.5)
WBC: 15.9 10*3/uL — AB (ref 4.0–10.5)
nRBC: 0 % (ref 0.0–0.2)

## 2018-02-11 NOTE — Anesthesia Postprocedure Evaluation (Signed)
Anesthesia Post Note  Patient: Jennifer Miranda  Procedure(s) Performed: AN AD HOC LABOR EPIDURAL     Patient location during evaluation: Mother Baby Anesthesia Type: Epidural Level of consciousness: awake and alert Pain management: pain level controlled Vital Signs Assessment: post-procedure vital signs reviewed and stable Respiratory status: spontaneous breathing, nonlabored ventilation and respiratory function stable Cardiovascular status: stable Postop Assessment: no headache, no backache and epidural receding Anesthetic complications: no    Last Vitals:  Vitals:   02/11/18 0320 02/11/18 0715  BP: 137/78 131/83  Pulse: 80 85  Resp: 18 18  Temp: 36.6 C 36.9 C  SpO2:  98%    Last Pain:  Vitals:   02/11/18 0715  TempSrc: Oral  PainSc: 2    Pain Goal: Patients Stated Pain Goal: 3 (02/11/18 0715)               Fanny DanceMULLINS,[name]

## 2018-02-11 NOTE — Progress Notes (Signed)
Post Partum Day 1 Subjective: no complaints, up ad lib, voiding and tolerating PO  Objective: Blood pressure 131/83, pulse 85, temperature 98.5 F (36.9 C), temperature source Oral, resp. rate 18, height 5' 1.5" (1.562 m), weight 117.1 kg, SpO2 98 %, unknown if currently breastfeeding.  Physical Exam:  General: alert, cooperative and appears stated age Lochia: appropriate Uterine Fundus: firm DVT Evaluation: No evidence of DVT seen on physical exam.  Recent Labs    02/10/18 0803 02/11/18 0526  HGB 11.0* 10.4*  HCT 34.6* 32.8*    Assessment/Plan: Plan for discharge tomorrow and Breastfeeding   LOS: 1 day   Waynard ReedsKendra Rudean Icenhour 02/11/2018, 9:56 AM

## 2018-02-11 NOTE — Lactation Note (Signed)
This note was copied from a baby's chart. Lactation Consultation Note  Patient Name: Jennifer Miranda JXBJY'NToday's Date: 02/11/2018 Reason for consult: Initial assessment  P2, Baby 17 hours old and latched upon entering in cradle hold. Demonstrated how to latch deeper with cross cradle hold. Reviewed hand expression w/ good flow expressed. Mother states she feels pinching and pulling. Noted short mid anterior lingual frenulum w/ tongue indention but baby does protrude tongue out of mouth and swallows were observed. Feed on demand approximately 8-12 times per day.   Mother's R nipple was flattened w/ blister near tip.  Provided comfort gels and worked on depth. She states it is more difficult to latch baby on L side so provided mother w/ manual pump to prepump before latching. Mom made aware of O/P services, breastfeeding support groups, community resources, and our phone # for post-discharge questions.     Maternal Data    Feeding Feeding Type: Breast Fed  LATCH Score Latch: Grasps breast easily, tongue down, lips flanged, rhythmical sucking.  Audible Swallowing: A few with stimulation  Type of Nipple: Everted at rest and after stimulation  Comfort (Breast/Nipple): Filling, red/small blisters or bruises, mild/mod discomfort  Hold (Positioning): Assistance needed to correctly position infant at breast and maintain latch.  LATCH Score: 7  Interventions Interventions: Breast feeding basics reviewed;Assisted with latch;Skin to skin;Hand express;Pre-pump if needed;Adjust position;Comfort gels;Hand pump  Lactation Tools Discussed/Used     Consult Status Consult Status: Follow-up Date: 02/12/18 Follow-up type: In-patient    Dahlia ByesBerkelhammer, Tequita Marrs [facility] 02/11/2018, 12:52 PM

## 2018-02-11 NOTE — Plan of Care (Signed)
Progressing appropriately. Encouraged to call for assistance as needed, and for [facility] assessment. Safety and room orientation complete.

## 2018-02-12 NOTE — Discharge Summary (Signed)
Obstetric Discharge Summary Reason for Admission: induction of labor and for AMA Prenatal Procedures: none Intrapartum Procedures: spontaneous vaginal delivery c/b shoulder dystocia Postpartum Procedures: none Complications-Operative and Postpartum: none Hemoglobin  Date Value Ref Range Status  02/11/2018 10.4 (L) 12.0 - 15.0 g/dL Final   HCT  Date Value Ref Range Status  02/11/2018 32.8 (L) 36.0 - 46.0 % Final    Physical Exam:  General: alert, cooperative and appears stated age 63Lochia: appropriate Uterine Fundus: firm DVT Evaluation: No evidence of DVT seen on physical exam.  Discharge Diagnoses: Term Pregnancy-delivered  Discharge Information: Date: 02/12/2018 Activity: pelvic rest Diet: routine Medications: PNV and Ibuprofen Condition: stable Instructions: refer to practice specific booklet Discharge to: home Follow-up Information    Jennifer Miranda, Jennifer Rhyne, Jennifer Miranda Follow up in 4 week(s).   Specialty:  Obstetrics Contact information: [address] [place] [address] [phone]           Newborn Data: Live born female  Birth Weight: 9 lb 1.7 oz (4130 g) APGAR: 2, 9  Newborn Delivery   Time head delivered:  02/10/2018 19:32:00 Birth date/time:  02/10/2018 19:34:00 Delivery type:  Vaginal, Spontaneous     Home with mother.  [facility] Hospital[facility]ANNA [name] 02/12/2018, 8:53 AM

## 2018-02-12 NOTE — Lactation Note (Signed)
This note was copied from a baby's chart. Lactation Consultation Note  Patient Name: Jennifer Bary Lericheeresa Mayson UYQIH'KToday's Date: 02/12/2018 Reason for consult: Follow-up assessment;Term;Infant weight loss(3% weight loss / P3 )  Baby is 7338 hours old As LC entered the room , the tech was coming to take the baby to the nursery for  A circ.  Per mom last feeding 7:30 - 8 am and took total 50 ml/ and 2 ml of EBM.  Per mom having nipple soreness both breast. LC offered to assess and mom receptive.  LC noted both nipples have  abrasions / the left with 3 tiny blisters.  Mom already has comfort gels , coconut oil, hand pump, and her own DEBP.  Mom asked about a Nipple Shield / LC sized mom and the #24 NS fit well for today.  Mom returned demo. Also a curved tip syringe given with instructions.  Mom mentioned she has tried latching and its ok for a few minutes and then starts  To be painful.  LC recommended comfort gels after feedings , when warm rinse with warm water  And alterate with breast shells except when sleeping.  Sore nipple and engorgement prevention and tx reviewed.  LC offered to request and LC O/P and mom receptive and LC placed in the Epic basket  For this dyad .  Mother informed of post-discharge support and given phone number to the lactation department, including services for phone call assistance; out-patient appointments; and breastfeeding support group. List of other breastfeeding resources in the community given in the handout. Encouraged mother to call for problems or concerns related to breastfeeding.  LC encouraged mom to call when baby returned from the nursery and if he was hungry for a feeding assessment with NS .  Mom did not call for LC .    Maternal Data Has patient been taught Hand Expression?: Yes(LC reviewed / drops noted )  Feeding Feeding Type: (pe rmom baby fed at 7:30 -0800 - took 50 ml total and 2 ml of EBM ) Nipple Type: Slow - flow  LATCH Score       Type of  Nipple: (assessed breast tissue due to soreness )           Interventions Interventions: Breast feeding basics reviewed;Shells;Coconut oil;Comfort gels;Hand pump  Lactation Tools Discussed/Used Tools: Shells;Pump;Flanges;Comfort gels;Coconut oil(mom had the hand pump and LC provided instructions larger flange and shells ) Flange Size: 24;27;Other (comment) Shell Type: Inverted Breast pump type: Manual WIC Program: No Pump Review: Setup, frequency, and cleaning   Consult Status Consult Status: Follow-up Date: ([facility] and mom receptive ) Follow-up type: Out-patient(LC placed request in the computer )    Jennifer GreathouseMargaret Ann [name] 02/12/2018, 9:36 AM

## 2018-02-12 NOTE — Progress Notes (Signed)
Patient is doing well.  She is ambulating, voiding, tolerating PO.  Pain control is good.  Lochia is appropriate.  Pain with latching  BP 138/85   Pulse 87   Temp 98.2 F (36.8 C) (Oral)   Resp 20   Ht 5' 1.5" (1.562 m)   Wt 117.1 kg   SpO2 99%   Breastfeeding Unknown   BMI 47.98 kg/m   NAD Fundus firm Ext: 1+ LE edema, symmetric b/l, neg homan's sign  Lab Results  Component Value Date   WBC 15.9 (H) 02/11/2018   HGB 10.4 (L) 02/11/2018   HCT 32.8 (L) 02/11/2018   MCV 87.9 02/11/2018   PLT 212 02/11/2018    --/--/O POS, O POS Performed at [facility], [address] [place], [place] [address]  (12/23 0803)/RImmune  A/P 42 y.o. [id number] PPD#2. Routine care.   Expect d/c today  Desires circumcision. Discussed r/b/a of the procedure. Reviewed that circumcision is an elective surgical procedure and not considered medically necessary. Reviewed the risks of the procedure including the risk of infection, bleeding, damage to surrounding structures, including scrotum, shaft, urethra and head of penis, and an undesired cosmetic effect requiring additional procedures for revision. Consent signed.  Marland Kitchen.    [facility] CenterDYANNA GEFFEL [employer]CL[employer]

## 2020-07-20 ENCOUNTER — Other Ambulatory Visit: Payer: Self-pay | Admitting: Obstetrics and Gynecology

## 2020-08-26 ENCOUNTER — Encounter (HOSPITAL_BASED_OUTPATIENT_CLINIC_OR_DEPARTMENT_OTHER): Payer: Self-pay | Admitting: Obstetrics and Gynecology

## 2020-08-26 ENCOUNTER — Other Ambulatory Visit: Payer: Self-pay

## 2020-08-26 DIAGNOSIS — M7730 Calcaneal spur, unspecified foot: Secondary | ICD-10-CM

## 2020-08-26 DIAGNOSIS — N393 Stress incontinence (female) (male): Secondary | ICD-10-CM

## 2020-08-26 HISTORY — DX: Stress incontinence (female) (male): N39.3

## 2020-08-26 HISTORY — DX: Calcaneal spur, unspecified foot: M77.30

## 2020-08-26 NOTE — Progress Notes (Signed)
YOU ARE SCHEDULED FOR A COVID TEST ON   08-29-2020 AT 105 PM.  THIS TEST MUST BE DONE BEFORE SURGERY. GO TO  [address] [place], [place], IT IS APPROXIMATELY 2 MINUTES PAST [employer] ON THE RIGHT AND REMAIN IN YOUR CAR, THIS IS A DRIVE UP TEST.       Your procedure is scheduled on  08-31-2020  [facility] to [facility] AT  630 A. M.   Call this number if you have problems the morning of surgery  :[phone].   OUR ADDRESS IS [address]  [facility]ATED [facility].  PLEASE BRING YOUR INSURANCE CARD AND PHOTO ID DAY OF SURGERY.  ONLY ONE PERSON ALLOWED IN FACILITY WAITING AREA.                                     REMEMBER:  DO NOT EAT FOOD, CANDY GUM OR MINTS  AFTER MIDNIGHT . YOU MAY HAVE CLEAR LIQUIDS FROM MIDNIGHT UNTIL 530 AM NO CLEAR LIQUIDS AFTER 530 AM DAY OF SURGERY.   YOU MAY  BRUSH YOUR TEETH MORNING OF SURGERY AND RINSE YOUR MOUTH OUT, NO CHEWING GUM CANDY OR MINTS.    CLEAR LIQUID DIET   Foods Allowed                                                                     Foods Excluded  Coffee and tea, regular and decaf                             liquids that you cannot  Plain Jell-O any favor except red or purple                                           see through such as: Fruit ices (not with fruit pulp)                                     milk, soups, orange juice  Iced Popsicles                                    All solid food Carbonated beverages, regular and diet                                    Cranberry, grape and apple juices Sports drinks like Gatorade Lightly seasoned clear broth or consume(fat free) Sugar, honey syrup  Sample Menu Breakfast                                Lunch  Supper Cranberry juice                    Beef broth                            Chicken broth Jell-O                                     Grape juice                           Apple  juice Coffee or tea                        Jell-O                                      Popsicle                                                Coffee or tea                        Coffee or tea  _____________________________________________________________________     TAKE THESE MEDICATIONS MORNING OF SURGERY WITH A SIP OF WATER:  LORATATIDINE, PEPCID   ONE VISITOR IS ALLOWED IN WAITING ROOM ONLY DAY OF SURGERY.  NO VISITOR MAY SPEND THE NIGHT.  VISITOR ARE ALLOWED TO STAY UNTIL 800 PM.                                    DO NOT WEAR JEWERLY, MAKE UP. DO NOT WEAR LOTIONS, POWDERS, PERFUMES OR NAIL POLISH. DO NOT SHAVE FOR 48 HOURS PRIOR TO DAY OF SURGERY. MEN MAY SHAVE FACE AND NECK. CONTACTS, GLASSES, OR DENTURES MAY NOT BE WORN TO SURGERY.                                    [facility] IS NOT RESPONSIBLE  FOR ANY BELONGINGS.                                                                    Marland Kitchen           [facility] - Preparing for Surgery Before surgery, you can play an important role.  Because skin is not sterile, your skin needs to be as free of germs as possible.  You can reduce the number of germs on your skin by washing with CHG (chlorahexidine gluconate) soap before surgery.  CHG is an antiseptic cleaner which kills germs and bonds with the skin to continue killing germs even after washing. Please DO NOT use if you have an allergy to CHG or antibacterial soaps.  If your skin  becomes reddened/irritated stop using the CHG and inform your nurse when you arrive at Short Stay. Do not shave (including legs and underarms) for at least 48 hours prior to the first CHG shower.  You may shave your face/neck. Please follow these instructions carefully:  1.  Shower with CHG Soap the night before surgery and the  morning of Surgery.  2.  If you choose to wash your hair, wash your hair first as usual with your  normal  shampoo.  3.  After you shampoo, rinse your hair and body thoroughly to remove  the  shampoo.                            4.  Use CHG as you would any other liquid soap.  You can apply chg directly  to the skin and wash                      Gently with a scrungie or clean washcloth.  5.  Apply the CHG Soap to your body ONLY FROM THE NECK DOWN.   Do not use on face/ open                           Wound or open sores. Avoid contact with eyes, ears mouth and genitals (private parts).                       Wash face,  Genitals (private parts) with your normal soap.             6.  Wash thoroughly, paying special attention to the area where your surgery  will be performed.  7.  Thoroughly rinse your body with warm water from the neck down.  8.  DO NOT shower/wash with your normal soap after using and rinsing off  the CHG Soap.                9.  Pat yourself dry with a clean towel.            10.  Wear clean pajamas.            11.  Place clean sheets on your bed the night of your first shower and do not  sleep with pets. Day of Surgery : Do not apply any lotions/deodorants the morning of surgery.  Please wear clean clothes to the hospital/surgery center.  FAILURE TO FOLLOW THESE INSTRUCTIONS MAY RESULT IN THE CANCELLATION OF YOUR SURGERY PATIENT SIGNATURE_________________________________  NURSE SIGNATURE__________________________________  ________________________________________________________________________                                                        QUESTIONS Hansel Feinstein PRE OP NURSE PHONE [phone].

## 2020-08-26 NOTE — Progress Notes (Signed)
Spoke w/ via phone for pre-op interview---PT Lab needs dos----    URINE PREG           Lab results------HAS LAB APPT 08-29-2020 AT 200 PM FOR CBC T & S COVID test -----08-29-2020 AT 1305 PM ARRIVE AT [facility]  630 AM 08-31-2020 NPO after MN NO Solid Food.  Clear liquids from MN until---530 AM THEN NPO Med rec completed Medications to take morning of surgery -----LORATADINE, PEPCID Diabetic medication -----N/A Patient instructed no nail polish to be worn day of surgery Patient instructed to bring photo id and insurance card day of surgery Patient aware to have Driver (ride ) / caregiver  Jennifer Miranda   for 24 hours after surgery  Patient Special Instructions -----PT GIVEN EXTENDED STAY INSTRUCTIONS Pre-Op special Istructions -----NONE Patient verbalized understanding of instructions that were given at this phone interview. Patient denies shortness of breath, chest pain, fever, cough at this phone interview.   PT HAS SILICONE [place] BAND ON

## 2020-08-29 ENCOUNTER — Encounter (HOSPITAL_COMMUNITY)
Admission: RE | Admit: 2020-08-29 | Discharge: 2020-08-29 | Disposition: A | Discharge status: Home / Self Care | Payer: No Typology Code available for payment source | Source: Ambulatory Visit | Attending: Obstetrics and Gynecology | Admitting: Obstetrics and Gynecology

## 2020-08-29 ENCOUNTER — Other Ambulatory Visit (HOSPITAL_COMMUNITY)
Admission: RE | Admit: 2020-08-29 | Discharge: 2020-08-29 | Disposition: A | Discharge status: Home / Self Care | Payer: No Typology Code available for payment source | Source: Ambulatory Visit | Attending: Obstetrics and Gynecology | Admitting: Obstetrics and Gynecology

## 2020-08-29 ENCOUNTER — Other Ambulatory Visit: Payer: Self-pay

## 2020-08-29 DIAGNOSIS — Z20822 Contact with and (suspected) exposure to covid-19: Secondary | ICD-10-CM | POA: Diagnosis not present

## 2020-08-29 DIAGNOSIS — Z01812 Encounter for preprocedural laboratory examination: Secondary | ICD-10-CM | POA: Diagnosis not present

## 2020-08-29 LAB — CBC
HCT: 40.1 % (ref 36.0–46.0)
Hemoglobin: 12.9 g/dL (ref 12.0–15.0)
MCH: 29.4 pg (ref 26.0–34.0)
MCHC: 32.2 g/dL (ref 30.0–36.0)
MCV: 91.3 fL (ref 80.0–100.0)
Platelets: 273 10*3/uL (ref 150–400)
RBC: 4.39 MIL/uL (ref 3.87–5.11)
RDW: 12.9 % (ref 11.5–15.5)
WBC: 7.6 10*3/uL (ref 4.0–10.5)
nRBC: 0 % (ref 0.0–0.2)

## 2020-08-30 LAB — SARS CORONAVIRUS 2 (TAT 6-24 HRS): SARS Coronavirus 2: NEGATIVE

## 2020-08-30 NOTE — Anesthesia Preprocedure Evaluation (Addendum)
Anesthesia Evaluation  Patient identified by MRN, date of birth, ID band Patient awake    Reviewed: Allergy & Precautions, NPO status , Patient's Chart, lab work & pertinent test results  History of Anesthesia Complications Negative for: history of anesthetic complications  Airway Mallampati: I  TM Distance: >3 FB Neck ROM: Full    Dental  (+) Dental Advisory Given   Pulmonary former smoker,  08/29/2020 SARS coronavirus NEG   breath sounds clear to auscultation       Cardiovascular negative cardio ROS   Rhythm:Regular Rate:Normal     Neuro/Psych  Headaches,    GI/Hepatic Neg liver ROS, GERD  Medicated and Controlled,  Endo/Other  Morbid obesity  Renal/GU negative Renal ROS     Musculoskeletal   Abdominal (+) + obese,   Peds  Hematology negative hematology ROS (+)   Anesthesia Other Findings   Reproductive/Obstetrics                            Anesthesia Physical Anesthesia Plan  ASA: 2  Anesthesia Plan: General   Post-op Pain Management:    Induction: Intravenous  PONV Risk Score and Plan: Dexamethasone and Scopolamine patch - Pre-op  Airway Management Planned: Oral ETT  Additional Equipment: None  Intra-op Plan:   Post-operative Plan: Extubation in OR  Informed Consent: I have reviewed the patients History and Physical, chart, labs and discussed the procedure including the risks, benefits and alternatives for the proposed anesthesia with the patient or authorized representative who has indicated his/her understanding and acceptance.     Dental advisory given  Plan Discussed with: CRNA and Surgeon  Anesthesia Plan Comments:        Anesthesia Quick Evaluation

## 2020-08-30 NOTE — Progress Notes (Signed)
Scheduler at Dr. Kittie Plater office called and voicemail left  to request correction of abbreviations on consent order.
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
## 2020-08-31 ENCOUNTER — Other Ambulatory Visit: Payer: Self-pay

## 2020-08-31 ENCOUNTER — Ambulatory Visit (HOSPITAL_BASED_OUTPATIENT_CLINIC_OR_DEPARTMENT_OTHER): Payer: No Typology Code available for payment source | Admitting: Anesthesiology

## 2020-08-31 ENCOUNTER — Ambulatory Visit (HOSPITAL_BASED_OUTPATIENT_CLINIC_OR_DEPARTMENT_OTHER)
Admission: RE | Admit: 2020-08-31 | Discharge: 2020-08-31 | Disposition: A | Discharge status: Home / Self Care | Payer: No Typology Code available for payment source | Source: Ambulatory Visit | Attending: Obstetrics and Gynecology | Admitting: Obstetrics and Gynecology

## 2020-08-31 ENCOUNTER — Encounter (HOSPITAL_BASED_OUTPATIENT_CLINIC_OR_DEPARTMENT_OTHER): Payer: Self-pay | Admitting: Obstetrics and Gynecology

## 2020-08-31 ENCOUNTER — Encounter (HOSPITAL_BASED_OUTPATIENT_CLINIC_OR_DEPARTMENT_OTHER)
Admission: RE | Disposition: A | Discharge status: Home / Self Care | Payer: Self-pay | Source: Ambulatory Visit | Attending: Obstetrics and Gynecology

## 2020-08-31 DIAGNOSIS — Z87891 Personal history of nicotine dependence: Secondary | ICD-10-CM | POA: Insufficient documentation

## 2020-08-31 DIAGNOSIS — Z91040 Latex allergy status: Secondary | ICD-10-CM | POA: Insufficient documentation

## 2020-08-31 DIAGNOSIS — N800 Endometriosis of uterus: Secondary | ICD-10-CM | POA: Insufficient documentation

## 2020-08-31 DIAGNOSIS — Z79899 Other long term (current) drug therapy: Secondary | ICD-10-CM | POA: Insufficient documentation

## 2020-08-31 DIAGNOSIS — Z9889 Other specified postprocedural states: Secondary | ICD-10-CM

## 2020-08-31 DIAGNOSIS — N393 Stress incontinence (female) (male): Secondary | ICD-10-CM | POA: Insufficient documentation

## 2020-08-31 DIAGNOSIS — N814 Uterovaginal prolapse, unspecified: Secondary | ICD-10-CM | POA: Diagnosis not present

## 2020-08-31 DIAGNOSIS — Z880 Allergy status to penicillin: Secondary | ICD-10-CM | POA: Diagnosis not present

## 2020-08-31 HISTORY — PX: VAGINAL HYSTERECTOMY: SHX2639

## 2020-08-31 HISTORY — PX: CYSTOCELE REPAIR: SHX163

## 2020-08-31 HISTORY — DX: Dorsalgia, unspecified: M54.9

## 2020-08-31 HISTORY — PX: BLADDER SUSPENSION: SHX72

## 2020-08-31 HISTORY — DX: Gastro-esophageal reflux disease without esophagitis: K21.9

## 2020-08-31 LAB — TYPE AND SCREEN
ABO/RH(D): O POS
Antibody Screen: NEGATIVE

## 2020-08-31 LAB — POCT PREGNANCY, URINE: Preg Test, Ur: NEGATIVE

## 2020-08-31 SURGERY — HYSTERECTOMY, VAGINAL
Anesthesia: General | Site: Vagina

## 2020-08-31 MED ORDER — FENTANYL CITRATE (PF) 100 MCG/2ML IJ SOLN
INTRAMUSCULAR | Status: DC | PRN
  Administered 2020-08-31: 50 ug via INTRAVENOUS
  Administered 2020-08-31 (×2): 25 ug via INTRAVENOUS
  Administered 2020-08-31: 50 ug via INTRAVENOUS
  Administered 2020-08-31 (×2): 25 ug via INTRAVENOUS

## 2020-08-31 MED ORDER — LIDOCAINE 2% (20 MG/ML) 5 ML SYRINGE
INTRAMUSCULAR | Status: DC | PRN
  Administered 2020-08-31: 20 mg via INTRAVENOUS

## 2020-08-31 MED ORDER — OXYCODONE-ACETAMINOPHEN 5-325 MG PO TABS
ORAL_TABLET | ORAL | Status: AC
  Filled 2020-08-31: qty 1

## 2020-08-31 MED ORDER — SODIUM CHLORIDE 0.9 % IR SOLN
Status: DC | PRN
  Administered 2020-08-31: 1000 mL via INTRAVESICAL

## 2020-08-31 MED ORDER — POVIDONE-IODINE 10 % EX SWAB: 2.0000 "application " | Freq: Once | CUTANEOUS | Status: DC

## 2020-08-31 MED ORDER — ONDANSETRON HCL 4 MG PO TABS: 4.0000 mg | ORAL_TABLET | Freq: Four times a day (QID) | ORAL | Status: DC | PRN

## 2020-08-31 MED ORDER — MEPERIDINE HCL 25 MG/ML IJ SOLN: 6.2500 mg | INTRAMUSCULAR | Status: DC | PRN

## 2020-08-31 MED ORDER — ROCURONIUM BROMIDE 10 MG/ML (PF) SYRINGE
PREFILLED_SYRINGE | INTRAVENOUS | Status: DC | PRN
  Administered 2020-08-31: 10 mg via INTRAVENOUS
  Administered 2020-08-31: 60 mg via INTRAVENOUS
  Administered 2020-08-31 (×2): 10 mg via INTRAVENOUS

## 2020-08-31 MED ORDER — KETOROLAC TROMETHAMINE 30 MG/ML IJ SOLN
INTRAMUSCULAR | Status: DC | PRN
  Administered 2020-08-31: 30 mg via INTRAVENOUS

## 2020-08-31 MED ORDER — ESTRADIOL 0.1 MG/GM VA CREA
TOPICAL_CREAM | VAGINAL | Status: DC | PRN
  Administered 2020-08-31: 1 via VAGINAL

## 2020-08-31 MED ORDER — PROMETHAZINE HCL 25 MG/ML IJ SOLN
6.2500 mg | INTRAMUSCULAR | Status: DC | PRN
  Administered 2020-08-31: 6.25 mg via INTRAVENOUS

## 2020-08-31 MED ORDER — PROMETHAZINE HCL 25 MG/ML IJ SOLN
INTRAMUSCULAR | Status: AC
  Filled 2020-08-31: qty 1

## 2020-08-31 MED ORDER — OXYCODONE-ACETAMINOPHEN 5-325 MG PO TABS: 1.0000 | ORAL_TABLET | ORAL | 0 refills | Status: DC | PRN

## 2020-08-31 MED ORDER — IBUPROFEN 200 MG PO TABS: 600.0000 mg | ORAL_TABLET | Freq: Four times a day (QID) | ORAL | Status: DC

## 2020-08-31 MED ORDER — SCOPOLAMINE 1 MG/3DAYS TD PT72
MEDICATED_PATCH | TRANSDERMAL | Status: AC
  Filled 2020-08-31: qty 1

## 2020-08-31 MED ORDER — OXYCODONE HCL 5 MG PO TABS: 5.0000 mg | ORAL_TABLET | Freq: Once | ORAL | Status: DC | PRN

## 2020-08-31 MED ORDER — HYDROMORPHONE HCL 1 MG/ML IJ SOLN
INTRAMUSCULAR | Status: AC
  Filled 2020-08-31: qty 1

## 2020-08-31 MED ORDER — SUGAMMADEX SODIUM 500 MG/5ML IV SOLN
INTRAVENOUS | Status: AC
  Filled 2020-08-31: qty 5

## 2020-08-31 MED ORDER — KETOROLAC TROMETHAMINE 30 MG/ML IJ SOLN
INTRAMUSCULAR | Status: AC
  Filled 2020-08-31: qty 1

## 2020-08-31 MED ORDER — OXYCODONE HCL 5 MG/5ML PO SOLN
5.0000 mg | Freq: Once | ORAL | Status: DC | PRN
Start: 2020-08-31 — End: 2020-08-31

## 2020-08-31 MED ORDER — ROCURONIUM BROMIDE 10 MG/ML (PF) SYRINGE
PREFILLED_SYRINGE | INTRAVENOUS | Status: AC
  Filled 2020-08-31: qty 10

## 2020-08-31 MED ORDER — KETOROLAC TROMETHAMINE 30 MG/ML IJ SOLN
30.0000 mg | Freq: Four times a day (QID) | INTRAMUSCULAR | Status: DC
  Administered 2020-08-31: 30 mg via INTRAVENOUS

## 2020-08-31 MED ORDER — ACETAMINOPHEN 500 MG PO TABS
1000.0000 mg | ORAL_TABLET | Freq: Once | ORAL | Status: AC
  Administered 2020-08-31: 1000 mg via ORAL

## 2020-08-31 MED ORDER — SCOPOLAMINE 1 MG/3DAYS TD PT72
1.0000 | MEDICATED_PATCH | TRANSDERMAL | Status: DC
  Administered 2020-08-31: 1.5 mg via TRANSDERMAL

## 2020-08-31 MED ORDER — PROPOFOL 10 MG/ML IV BOLUS
INTRAVENOUS | Status: DC | PRN
  Administered 2020-08-31: 200 mg via INTRAVENOUS

## 2020-08-31 MED ORDER — CIPROFLOXACIN IN D5W 400 MG/200ML IV SOLN
400.0000 mg | INTRAVENOUS | Status: AC
  Administered 2020-08-31: 400 mg via INTRAVENOUS

## 2020-08-31 MED ORDER — LACTATED RINGERS IV SOLN: INTRAVENOUS | Status: DC

## 2020-08-31 MED ORDER — MIDAZOLAM HCL 2 MG/2ML IJ SOLN
INTRAMUSCULAR | Status: DC | PRN
  Administered 2020-08-31: 2 mg via INTRAVENOUS

## 2020-08-31 MED ORDER — DEXTROSE IN LACTATED RINGERS 5 % IV SOLN: INTRAVENOUS | Status: DC

## 2020-08-31 MED ORDER — SOD CITRATE-CITRIC ACID 500-334 MG/5ML PO SOLN: 30.0000 mL | ORAL | Status: DC

## 2020-08-31 MED ORDER — MIDAZOLAM HCL 2 MG/2ML IJ SOLN
INTRAMUSCULAR | Status: AC
  Filled 2020-08-31: qty 2

## 2020-08-31 MED ORDER — ONDANSETRON HCL 4 MG/2ML IJ SOLN: 4.0000 mg | Freq: Four times a day (QID) | INTRAMUSCULAR | Status: DC | PRN

## 2020-08-31 MED ORDER — CIPROFLOXACIN IN D5W 400 MG/200ML IV SOLN
INTRAVENOUS | Status: AC
  Filled 2020-08-31: qty 200

## 2020-08-31 MED ORDER — CLINDAMYCIN PHOSPHATE 900 MG/50ML IV SOLN
900.0000 mg | INTRAVENOUS | Status: AC
  Administered 2020-08-31: 900 mg via INTRAVENOUS

## 2020-08-31 MED ORDER — LIDOCAINE HCL (PF) 2 % IJ SOLN
INTRAMUSCULAR | Status: AC
  Filled 2020-08-31: qty 5

## 2020-08-31 MED ORDER — HYDROMORPHONE HCL 1 MG/ML IJ SOLN
0.2500 mg | INTRAMUSCULAR | Status: DC | PRN
  Administered 2020-08-31: 0.5 mg via INTRAVENOUS

## 2020-08-31 MED ORDER — ONDANSETRON HCL 4 MG/2ML IJ SOLN
INTRAMUSCULAR | Status: DC | PRN
  Administered 2020-08-31: 4 mg via INTRAVENOUS

## 2020-08-31 MED ORDER — FENTANYL CITRATE (PF) 100 MCG/2ML IJ SOLN
INTRAMUSCULAR | Status: AC
  Filled 2020-08-31: qty 2

## 2020-08-31 MED ORDER — SUGAMMADEX SODIUM 200 MG/2ML IV SOLN
INTRAVENOUS | Status: DC | PRN
  Administered 2020-08-31: 250 mg via INTRAVENOUS

## 2020-08-31 MED ORDER — DOCUSATE SODIUM 100 MG PO CAPS: 100.0000 mg | ORAL_CAPSULE | Freq: Two times a day (BID) | ORAL | Status: DC

## 2020-08-31 MED ORDER — MIDAZOLAM HCL 2 MG/2ML IJ SOLN: 0.5000 mg | Freq: Once | INTRAMUSCULAR | Status: DC | PRN

## 2020-08-31 MED ORDER — 0.9 % SODIUM CHLORIDE (POUR BTL) OPTIME
TOPICAL | Status: DC | PRN
  Administered 2020-08-31: 500 mL

## 2020-08-31 MED ORDER — OXYCODONE-ACETAMINOPHEN 5-325 MG PO TABS
1.0000 | ORAL_TABLET | ORAL | Status: DC | PRN
  Administered 2020-08-31 (×3): 1 via ORAL

## 2020-08-31 MED ORDER — PROPOFOL 10 MG/ML IV BOLUS
INTRAVENOUS | Status: AC
  Filled 2020-08-31: qty 20

## 2020-08-31 MED ORDER — ONDANSETRON HCL 4 MG/2ML IJ SOLN
INTRAMUSCULAR | Status: AC
  Filled 2020-08-31: qty 2

## 2020-08-31 MED ORDER — INDIGOTINDISULFONATE SODIUM 8 MG/ML IJ SOLN
INTRAMUSCULAR | Status: DC | PRN
  Administered 2020-08-31: 5 mL via INTRAVENOUS

## 2020-08-31 MED ORDER — CLINDAMYCIN PHOSPHATE 900 MG/50ML IV SOLN
INTRAVENOUS | Status: AC
  Filled 2020-08-31: qty 50

## 2020-08-31 MED ORDER — DEXAMETHASONE SODIUM PHOSPHATE 10 MG/ML IJ SOLN
INTRAMUSCULAR | Status: DC | PRN
  Administered 2020-08-31: 5 mg via INTRAVENOUS

## 2020-08-31 MED ORDER — ACETAMINOPHEN 500 MG PO TABS
ORAL_TABLET | ORAL | Status: AC
  Filled 2020-08-31: qty 2

## 2020-08-31 MED ORDER — WHITE PETROLATUM EX OINT
TOPICAL_OINTMENT | CUTANEOUS | Status: AC
  Filled 2020-08-31: qty 5

## 2020-08-31 MED ORDER — DEXAMETHASONE SODIUM PHOSPHATE 10 MG/ML IJ SOLN
INTRAMUSCULAR | Status: AC
  Filled 2020-08-31: qty 1

## 2020-08-31 MED ORDER — LIDOCAINE-EPINEPHRINE (PF) 1 %-1:200000 IJ SOLN
INTRAMUSCULAR | Status: DC | PRN
  Administered 2020-08-31: 7 mL
  Administered 2020-08-31: 8 mL

## 2020-08-31 MED ORDER — MENTHOL 3 MG MT LOZG: 1.0000 | LOZENGE | OROMUCOSAL | Status: DC | PRN

## 2020-08-31 SURGICAL SUPPLY — 38 items
ADH SKN CLS APL DERMABOND .7 (GAUZE/BANDAGES/DRESSINGS) ×2
AGENT HMST KT MTR STRL THRMB (HEMOSTASIS) ×4
BLADE SURG 15 STRL LF DISP TIS (BLADE) ×4 IMPLANT
BLADE SURG 15 STRL SS (BLADE) ×6
COVER MAYO STAND STRL (DRAPES) ×2 IMPLANT
DECANTER SPIKE VIAL GLASS SM (MISCELLANEOUS) ×3 IMPLANT
DERMABOND ADVANCED (GAUZE/BANDAGES/DRESSINGS) ×1
DERMABOND ADVANCED .7 DNX12 (GAUZE/BANDAGES/DRESSINGS) ×2 IMPLANT
GAUZE 4X4 16PLY ~~LOC~~+RFID DBL (SPONGE) ×3 IMPLANT
GAUZE PACKING 2X5 YD STRL (GAUZE/BANDAGES/DRESSINGS) ×3 IMPLANT
GLOVE SURG ENC MOIS LTX SZ7 (GLOVE) ×3 IMPLANT
GLOVE SURG UNDER POLY LF SZ6.5 (GLOVE) ×3 IMPLANT
GOWN STRL REUS W/TWL LRG LVL3 (GOWN DISPOSABLE) ×12 IMPLANT
KIT TURNOVER CYSTO (KITS) ×3 IMPLANT
MARKER SKIN DUAL TIP RULER LAB (MISCELLANEOUS) IMPLANT
NDL SPNL 22GX3.5 QUINCKE BK (NEEDLE) IMPLANT
NEEDLE HYPO 22GX1.5 SAFETY (NEEDLE) ×3 IMPLANT
NEEDLE SPNL 22GX3.5 QUINCKE BK (NEEDLE) IMPLANT
NS IRRIG 1000ML POUR BTL (IV SOLUTION) ×3 IMPLANT
PACK VAGINAL WOMENS (CUSTOM PROCEDURE TRAY) ×3 IMPLANT
PAD OB MATERNITY 4.3X12.25 (PERSONAL CARE ITEMS) ×3 IMPLANT
SET IRRIG Y TYPE TUR BLADDER L (SET/KITS/TRAYS/PACK) ×3 IMPLANT
SLING TRANS VAGINAL TAPE (Sling) ×1 IMPLANT
SLING UTERINE/ABD GYNECARE TVT (Sling) ×2 IMPLANT
SURGIFLO W/THROMBIN 8M KIT (HEMOSTASIS) ×2 IMPLANT
SUT VIC AB 0 CT1 18XCR BRD8 (SUTURE) ×5 IMPLANT
SUT VIC AB 0 CT1 8-18 (SUTURE) ×9
SUT VIC AB 2-0 CT1 (SUTURE) ×4 IMPLANT
SUT VIC AB 2-0 CT1 27 (SUTURE) ×3
SUT VIC AB 2-0 CT1 TAPERPNT 27 (SUTURE) ×2 IMPLANT
SUT VIC AB 2-0 UR6 27 (SUTURE) IMPLANT
SUT VIC AB 3-0 SH 27 (SUTURE)
SUT VIC AB 3-0 SH 27X BRD (SUTURE) IMPLANT
SYR BULB IRRIG 60ML STRL (SYRINGE) ×3 IMPLANT
SYR TOOMEY IRRIG 70ML (MISCELLANEOUS) ×3
SYRINGE TOOMEY IRRIG 70ML (MISCELLANEOUS) ×2 IMPLANT
TOWEL OR 17X26 10 PK STRL BLUE (TOWEL DISPOSABLE) ×6 IMPLANT
TRAY FOLEY W/BAG SLVR 14FR LF (SET/KITS/TRAYS/PACK) ×3 IMPLANT

## 2020-08-31 NOTE — Transfer of Care (Signed)
Immediate Anesthesia Transfer of Care Note  Patient: Jennifer Miranda  Procedure(s) Performed: Procedure(s) (LRB): HYSTERECTOMY VAGINAL (N/A) TRANSVAGINAL TAPE (TVT) PROCEDURE (Bilateral) ANTERIOR REPAIR (CYSTOCELE) (N/A)  Patient Location: PACU  Anesthesia Type: General  Level of Consciousness: awake, oriented, sedated and patient cooperative  Airway & Oxygen Therapy: Patient Spontanous Breathing and Patient connected to face mask oxygen  Post-op Assessment: Report given to PACU RN and Post -op Vital signs reviewed and stable  Post vital signs: Reviewed and stable  Complications: No apparent anesthesia complications  Last Vitals:  Vitals Value Taken Time  BP 125/65 08/31/20 1045  Temp    Pulse 72 08/31/20 1048  Resp 18 08/31/20 1048  SpO2 95 % 08/31/20 1048  Vitals shown include unvalidated device data.  Last Pain:  Vitals:   08/31/20 0718  TempSrc: Oral         Complications: No notable events documented.

## 2020-08-31 NOTE — Anesthesia Procedure Notes (Addendum)
Procedure Name: Intubation Date/Time: 08/31/2020 3:51 AM Performed by: Suan Halter, CRNA Pre-anesthesia Checklist: Patient identified, Emergency Drugs available, Suction available and Patient being monitored Patient Re-evaluated:Patient Re-evaluated prior to induction Oxygen Delivery Method: Circle system utilized Preoxygenation: Pre-oxygenation with 100% oxygen Induction Type: IV induction Ventilation: Mask ventilation without difficulty Laryngoscope Size: Mac and 3 Grade View: Grade I Tube type: Oral Tube size: 7.0 mm Number of attempts: 1 Airway Equipment and Method: Stylet and Oral airway Placement Confirmation: ETT inserted through vocal cords under direct vision, positive ETCO2 and breath sounds checked- equal and bilateral Secured at: 22 cm Tube secured with: Tape Dental Injury: Teeth and Oropharynx as per pre-operative assessment

## 2020-08-31 NOTE — Brief Op Note (Signed)
08/31/2020  10:30 AM  PATIENT:  Augusto Gamble  45 y.o. female  PRE-OPERATIVE DIAGNOSIS:  N39.3 Stress Incontinence  POST-OPERATIVE DIAGNOSIS:  N39.3 Stress Incontinence  PROCEDURE:  Procedure(s): HYSTERECTOMY VAGINAL (N/A) TRANSVAGINAL TAPE (TVT) PROCEDURE (Bilateral) ANTERIOR REPAIR (CYSTOCELE) (N/A)  SURGEON:  Surgeon(s) and Role:    * Carrington Clamp, MD - Primary    * Marinone, Terrance Mass, MD - Assisting  ANESTHESIA:   general  EBL:  100 mL    DRAINS: Urinary Catheter (Foley)   LOCAL MEDICATIONS USED:  LIDOCAINE, surgiflo, estrace cream  SPECIMEN:  Source of Specimen:  uterus, cervix, bilateral tubes  DISPOSITION OF SPECIMEN:  PATHOLOGY  COUNTS:  YES  TOURNIQUET:  * No tourniquets in log *  DICTATION: .Note written in EPIC  PLAN OF CARE: RCC and home today  PATIENT DISPOSITION:  PACU - hemodynamically stable.   Delay start of Pharmacological VTE agent (>24hrs) due to surgical blood loss or risk of bleeding: not applicable

## 2020-08-31 NOTE — Anesthesia Postprocedure Evaluation (Signed)
Anesthesia Post Note  Patient: Jennifer Miranda  Procedure(s) Performed: HYSTERECTOMY VAGINAL (Vagina ) TRANSVAGINAL TAPE (TVT) PROCEDURE (Bilateral: Vagina ) ANTERIOR REPAIR (CYSTOCELE) (Vagina )     Patient location during evaluation: PACU Anesthesia Type: General Level of consciousness: awake and alert, patient cooperative and oriented Pain management: pain level controlled Vital Signs Assessment: post-procedure vital signs reviewed and stable Respiratory status: spontaneous breathing, nonlabored ventilation and respiratory function stable Cardiovascular status: blood pressure returned to baseline and stable Postop Assessment: no apparent nausea or vomiting Anesthetic complications: no   No notable events documented.  Last Vitals:  Vitals:   08/31/20 1150 08/31/20 1220  BP: 130/67 118/63  Pulse: 73 73  Resp: 15 15  Temp: 36.9 C 36.6 C  SpO2: 94% 98%    Last Pain:  Vitals:   08/31/20 1225  TempSrc:   PainSc: 6                  Janaisa Birkland,E. Christinamarie Tall

## 2020-08-31 NOTE — Op Note (Signed)
08/31/2020  10:30 AM  PATIENT:  Augusto Gamble  45 y.o. female  PRE-OPERATIVE DIAGNOSIS:  N39.3 Stress Incontinence  POST-OPERATIVE DIAGNOSIS:  N39.3 Stress Incontinence  PROCEDURE:  Procedure(s): HYSTERECTOMY VAGINAL (N/A) TRANSVAGINAL TAPE (TVT) PROCEDURE (Bilateral) ANTERIOR REPAIR (CYSTOCELE) (N/A)  SURGEON:  Surgeon(s) and Role:    * Carrington Clamp, MD - Primary    * Marinone, Terrance Mass, MD - Assisting  ANESTHESIA:   general  EBL:  100 mL    DRAINS: Urinary Catheter (Foley)   LOCAL MEDICATIONS USED:  LIDOCAINE, surgiflo, estrace cream  SPECIMEN:  Source of Specimen:  uterus, cervix, bilateral tubes  DISPOSITION OF SPECIMEN:  PATHOLOGY  COUNTS:  YES  TOURNIQUET:  * No tourniquets in log *  DICTATION: .Note written in EPIC  PLAN OF CARE: RCC and home today  PATIENT DISPOSITION:  PACU - hemodynamically stable.   Delay start of Pharmacological VTE agent (>24hrs) due to surgical blood loss or risk of bleeding: not applicable  Meds:  Similac in bladder, lidocaine with epi, indigo carmine. Gel foam and estrace.  Findings: cystocele to +1 and 9 week size uterus and normal ovaries.  Technique:  Meds:  Surgiflo, lidocaine with epi, indigo carmine,, and estrace.  Findings: cystocele to +1, 7 week size uterus and normal ovaries.  Prolapse to the introitus. Intact bladder and good flow from ureters.    Technique:  After general anesthesia was achieved, the patient was prepped and draped in a sterile fashion.  The bladder was emptied with a foley. The cervix was grasped with a pair of Lahey clamps and injected circumferentially with 0.5% lidocaine with epi. A circumferential incision was made around the cervix with the scalpel at the level of the reflection of the vagina onto the cervix and the posterior cul-de-sac was entered into with Mayo scissors. The long billed duckbill retractor was then placed and the bladder was removed off the cervix carefully with sharp  dissection with the Metzenbaums. The the uterosacrals were grasped with a pair heney clamps on either side and secured with a Heaney stitch of 0 Vicryl. Cardinal ligament was then divided with alternating successive bites of the Heaney clamp followed by incision with the Mayo scissors and secured with stitches of 0 Vicryl at the level of the cornua Heaneys were placed bilaterally around the entire pedicle and the uterus was able to be amputated.  The pedicles were secured with a free tie of 0 vicryl.  The ovaries and tubes on each side were successively brought down with a babcock and the IP ligaments identified.  Each tube was grasped with a babcock and secured with a kelly.  The pedicles were each secured with a free hand stitch of 0 Vicryl. Once hemostasis was achieved the peritoneum was closed in a pursestring fashion including the bilateral uterosacrals and posteriorly in and out of the vagina and a partial Halbans culdoplasty.  The stitch was pulled taut closing the peritoneum and pulling the uterosacrals together through the modified Halbans and through the vagina.   The anterior vaginal mucosa was then entered into in the midline with the help of Alice's after being injected with lidocaine with epi with the scalpel. The vaginal mucosa was then reflected off of the vesicouterine fascia with careful sharp dissection with the Metzenbaums until the pelvic floor could feel be felt underneath the pubic bone. 2 small stab incisions are made on 2 cm either side of the midline just above the pubic bone. The abdominal needles were placed carefully  through the pelvic floor and out the vagina. The Foley was removed and the cystoscope placed inside the bladder; there were no needles in the bladder, there was good spillage of indigo carmine from the bilateral ureteral orifices and the trigone and urethra were clear.  The foley was replaced. The abdominal needles were attached the vaginal needles and the tape pulled  through and cut into place. A Tresa Endo was used to ensure that the there was no tension placed on the tape. Once I was satisfied that the sling was in the correct place and at no tension, the anterior repair was done with 2 mattress stitches of 0 Vicryl. Hemostasis was achieved with surgiflo. The vaginal mucosa was trimmed and closed with a running locked stitch of 2-0 Vicryl. The cuff was then closed with interrupted figure-of-eight stitches of 0 Vicryl. A vaginal pack was placed inside the vagina with Estrace cream and the patient tolerated the procedure was returned to recovery in stable condition.   Sadat Sliwa A

## 2020-08-31 NOTE — Progress Notes (Signed)
There has been no change in the patients history, status or exam since the history and physical.  Vitals:   08/26/20 1305 08/31/20 0718  BP:  130/85  Pulse:  78  Resp:  17  Temp:  98.7 F (37.1 C)  TempSrc:  Oral  SpO2:  100%  Weight: 103.1 kg 104.1 kg  Height: 5\' 2"  (1.575 m) 5\' 2"  (1.575 m)    Results for orders placed or performed during the hospital encounter of 08/31/20 (from the past 72 hour(s))  Pregnancy, urine POC     Status: None   Collection Time: 08/31/20  7:26 AM  Result Value Ref Range   Preg Test, Ur NEGATIVE NEGATIVE    Comment:        THE SENSITIVITY OF THIS METHODOLOGY IS >24 mIU/mL     09/02/20

## 2020-08-31 NOTE — Discharge Summary (Signed)
Physician Discharge Summary  Patient ID: Jennifer Miranda MRN: [id number] DOB/AGE: 43-21-1977 43 y.o.  Admit date: 08/31/2020 Discharge date: 08/31/2020  Admission Diagnoses:uterine prolapse and stress incontinence  Discharge Diagnoses:  Active Problems:   Postoperative state   Discharged Condition: good  Hospital Course: uncomplicted total vaginal hysterectomy with bilateral salpingectomies, cystoscopy, TVT and A repair.  Consults: None  Significant Diagnostic Studies: none  Treatments: surgery: uncomplicted total vaginal hysterectomy with bilateral salpingectomies, cystoscopy, TVT and A repair.  Discharge Exam: Blood pressure 130/85, pulse 78, temperature 98.7 F (37.1 C), temperature source Oral, resp. rate 17, height 5\' 2"  (1.575 m), weight 104.1 kg, last menstrual period 08/20/2020, SpO2 100 %, unknown if currently breastfeeding.   Disposition: Discharge disposition: 01-Home or Self Care       Discharge Instructions     Call MD for:  temperature >100.4   Complete by: As directed    Diet - low sodium heart healthy   Complete by: As directed    Discharge instructions   Complete by: As directed    No driving on narcotics, no sexual activity for 2 weeks.   Increase activity slowly   Complete by: As directed    May shower / Bathe   Complete by: As directed    Shower, no bath for 2 weeks.   Remove dressing in 24 hours   Complete by: As directed    Sexual Activity Restrictions   Complete by: As directed    No sexual activity for 2 weeks.      Allergies as of 08/31/2020       Reactions   Penicillins Anaphylaxis   Has patient had a PCN reaction causing immediate rash, facial/tongue/throat swelling, SOB or lightheadedness with hypotension: Yes Has patient had a PCN reaction causing severe rash involving mucus membranes or skin necrosis: No Has patient had a PCN reaction that required hospitalization: Yes Has patient had a PCN reaction occurring within the last 10  years: No **cardiac arrest @ 09/02/2020 of age** If all of the above answers are "NO", then may proceed with Cephalosporin use.   Latex Rash        Medication List     TAKE these medications    famotidine 20 MG tablet Commonly known as: PEPCID Take 20 mg by mouth daily.   loratadine 10 MG tablet Commonly known as: CLARITIN Take 10 mg by mouth daily.   oxyCODONE-acetaminophen 5-325 MG tablet Commonly known as: PERCOCET/ROXICET Take 1 tablet by mouth every 4 (four) hours as needed for severe pain.   VITAMIN D PO Take by mouth daily.        Follow-up Information     , MD Follow up in 2 week(s).   Specialty: Obstetrics and Gynecology Contact information: [address] [place] [address] [place] [phone]                 Signed: [phone] 08/31/2020, 10:45 AM

## 2020-09-01 ENCOUNTER — Encounter (HOSPITAL_BASED_OUTPATIENT_CLINIC_OR_DEPARTMENT_OTHER): Payer: Self-pay | Admitting: Obstetrics and Gynecology

## 2020-09-01 LAB — SURGICAL PATHOLOGY

## 2020-09-06 ENCOUNTER — Emergency Department (HOSPITAL_BASED_OUTPATIENT_CLINIC_OR_DEPARTMENT_OTHER)
Admission: EM | Admit: 2020-09-06 | Discharge: 2020-09-06 | Disposition: A | Discharge status: Home / Self Care | Payer: No Typology Code available for payment source | Attending: Emergency Medicine | Admitting: Emergency Medicine

## 2020-09-06 ENCOUNTER — Emergency Department (HOSPITAL_BASED_OUTPATIENT_CLINIC_OR_DEPARTMENT_OTHER): Payer: No Typology Code available for payment source

## 2020-09-06 ENCOUNTER — Encounter (HOSPITAL_BASED_OUTPATIENT_CLINIC_OR_DEPARTMENT_OTHER): Payer: Self-pay

## 2020-09-06 ENCOUNTER — Other Ambulatory Visit: Payer: Self-pay

## 2020-09-06 DIAGNOSIS — R072 Precordial pain: Secondary | ICD-10-CM | POA: Insufficient documentation

## 2020-09-06 DIAGNOSIS — Z87891 Personal history of nicotine dependence: Secondary | ICD-10-CM | POA: Insufficient documentation

## 2020-09-06 DIAGNOSIS — Z91040 Latex allergy status: Secondary | ICD-10-CM | POA: Diagnosis not present

## 2020-09-06 DIAGNOSIS — R0789 Other chest pain: Secondary | ICD-10-CM

## 2020-09-06 LAB — BASIC METABOLIC PANEL
Anion gap: 8 (ref 5–15)
BUN: 7 mg/dL (ref 6–20)
CO2: 23 mmol/L (ref 22–32)
Calcium: 8.8 mg/dL — ABNORMAL LOW (ref 8.9–10.3)
Chloride: 104 mmol/L (ref 98–111)
Creatinine, Ser: 0.62 mg/dL (ref 0.44–1.00)
GFR, Estimated: >60 mL/min (ref >60)
Glucose, Bld: 102 mg/dL — ABNORMAL HIGH (ref 70–99)
Potassium: 4.1 mmol/L (ref 3.5–5.1)
Sodium: 135 mmol/L (ref 135–145)

## 2020-09-06 LAB — TROPONIN I (HIGH SENSITIVITY): Troponin I (High Sensitivity): <2 ng/L (ref <18)

## 2020-09-06 LAB — CBC
HCT: 36 % (ref 36.0–46.0)
Hemoglobin: 12.1 g/dL (ref 12.0–15.0)
MCH: 29.8 pg (ref 26.0–34.0)
MCHC: 33.6 g/dL (ref 30.0–36.0)
MCV: 88.7 fL (ref 80.0–100.0)
Platelets: 293 10*3/uL (ref 150–400)
RBC: 4.06 MIL/uL (ref 3.87–5.11)
RDW: 12.6 % (ref 11.5–15.5)
WBC: 10.8 10*3/uL — ABNORMAL HIGH (ref 4.0–10.5)
nRBC: 0 % (ref 0.0–0.2)
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
## 2020-09-06 NOTE — ED Triage Notes (Signed)
Pt c/o CP x 45 min-states she had hysterectomy and bladder surgery 7/13-NAD/tearful-steady gait

## 2020-09-16 ENCOUNTER — Other Ambulatory Visit: Payer: Self-pay | Admitting: Obstetrics and Gynecology

## 2020-09-16 ENCOUNTER — Observation Stay
Admission: AD | Admit: 2020-09-16 | Payer: No Typology Code available for payment source | Source: Ambulatory Visit | Admitting: Obstetrics and Gynecology

## 2020-09-16 ENCOUNTER — Encounter (HOSPITAL_COMMUNITY): Payer: Self-pay

## 2020-09-16 ENCOUNTER — Emergency Department (HOSPITAL_COMMUNITY): Payer: No Typology Code available for payment source

## 2020-09-16 ENCOUNTER — Other Ambulatory Visit: Payer: Self-pay

## 2020-09-16 ENCOUNTER — Inpatient Hospital Stay (HOSPITAL_COMMUNITY)
Admission: EM | Admit: 2020-09-16 | Discharge: 2020-09-19 | DRG: 863 | Disposition: A | Discharge status: Home / Self Care | Payer: No Typology Code available for payment source | Source: Ambulatory Visit | Attending: Obstetrics | Admitting: Obstetrics

## 2020-09-16 DIAGNOSIS — Z9889 Other specified postprocedural states: Secondary | ICD-10-CM

## 2020-09-16 DIAGNOSIS — Y836 Removal of other organ (partial) (total) as the cause of abnormal reaction of the patient, or of later complication, without mention of misadventure at the time of the procedure: Secondary | ICD-10-CM | POA: Diagnosis present

## 2020-09-16 DIAGNOSIS — R1084 Generalized abdominal pain: Secondary | ICD-10-CM

## 2020-09-16 DIAGNOSIS — Z880 Allergy status to penicillin: Secondary | ICD-10-CM | POA: Diagnosis not present

## 2020-09-16 DIAGNOSIS — N730 Acute parametritis and pelvic cellulitis: Secondary | ICD-10-CM | POA: Diagnosis present

## 2020-09-16 DIAGNOSIS — Z20822 Contact with and (suspected) exposure to covid-19: Secondary | ICD-10-CM | POA: Diagnosis present

## 2020-09-16 DIAGNOSIS — Z91040 Latex allergy status: Secondary | ICD-10-CM | POA: Diagnosis not present

## 2020-09-16 DIAGNOSIS — N739 Female pelvic inflammatory disease, unspecified: Secondary | ICD-10-CM | POA: Diagnosis present

## 2020-09-16 DIAGNOSIS — A419 Sepsis, unspecified organism: Secondary | ICD-10-CM

## 2020-09-16 DIAGNOSIS — T8143XA Infection following a procedure, organ and space surgical site, initial encounter: Secondary | ICD-10-CM | POA: Diagnosis present

## 2020-09-16 LAB — CBC
HCT: 35.9 % — ABNORMAL LOW (ref 36.0–46.0)
Hemoglobin: 11.8 g/dL — ABNORMAL LOW (ref 12.0–15.0)
MCH: 29.5 pg (ref 26.0–34.0)
MCHC: 32.9 g/dL (ref 30.0–36.0)
MCV: 89.8 fL (ref 80.0–100.0)
Platelets: 366 10*3/uL (ref 150–400)
RBC: 4 MIL/uL (ref 3.87–5.11)
RDW: 12.4 % (ref 11.5–15.5)
WBC: 21.6 10*3/uL — ABNORMAL HIGH (ref 4.0–10.5)
nRBC: 0 % (ref 0.0–0.2)

## 2020-09-16 LAB — URINALYSIS, ROUTINE W REFLEX MICROSCOPIC
Bilirubin Urine: NEGATIVE
Glucose, UA: NEGATIVE mg/dL
Ketones, ur: 80 mg/dL — AB
Nitrite: NEGATIVE
Protein, ur: 100 mg/dL — AB
Specific Gravity, Urine: 1.021 (ref 1.005–1.030)
WBC, UA: >50 WBC/hpf — ABNORMAL HIGH (ref 0–5)
pH: 5 (ref 5.0–8.0)

## 2020-09-16 LAB — LACTIC ACID, PLASMA: Lactic Acid, Venous: 1.1 mmol/L (ref 0.5–1.9)

## 2020-09-16 LAB — COMPREHENSIVE METABOLIC PANEL
ALT: 15 U/L (ref 0–44)
AST: 14 U/L — ABNORMAL LOW (ref 15–41)
Albumin: 3.2 g/dL — ABNORMAL LOW (ref 3.5–5.0)
Alkaline Phosphatase: 83 U/L (ref 38–126)
Anion gap: 11 (ref 5–15)
BUN: 8 mg/dL (ref 6–20)
CO2: 22 mmol/L (ref 22–32)
Calcium: 8.8 mg/dL — ABNORMAL LOW (ref 8.9–10.3)
Chloride: 101 mmol/L (ref 98–111)
Creatinine, Ser: 0.83 mg/dL (ref 0.44–1.00)
GFR, Estimated: >60 mL/min (ref >60)
Glucose, Bld: 92 mg/dL (ref 70–99)
Potassium: 3.1 mmol/L — ABNORMAL LOW (ref 3.5–5.1)
Sodium: 134 mmol/L — ABNORMAL LOW (ref 135–145)
Total Bilirubin: 0.3 mg/dL (ref 0.3–1.2)
Total Protein: 6.9 g/dL (ref 6.5–8.1)

## 2020-09-16 LAB — APTT: aPTT: 34 seconds (ref 24–36)

## 2020-09-16 LAB — POC OCCULT BLOOD, ED: Fecal Occult Bld: NEGATIVE

## 2020-09-16 LAB — PROTIME-INR
INR: 1.2 (ref 0.8–1.2)
Prothrombin Time: 15.4 seconds — ABNORMAL HIGH (ref 11.4–15.2)

## 2020-09-16 LAB — RESP PANEL BY RT-PCR (FLU A&B, COVID) ARPGX2
Influenza A by PCR: NEGATIVE
Influenza B by PCR: NEGATIVE
SARS Coronavirus 2 by RT PCR: NEGATIVE

## 2020-09-16 MED ORDER — ONDANSETRON HCL 4 MG/2ML IJ SOLN
4.0000 mg | Freq: Once | INTRAMUSCULAR | Status: AC
  Administered 2020-09-16: 4 mg via INTRAVENOUS
  Filled 2020-09-16: qty 2

## 2020-09-16 MED ORDER — DEXTROSE 5 % IV SOLN: 2.0000 g | Freq: Two times a day (BID) | INTRAVENOUS | Status: DC

## 2020-09-16 MED ORDER — ALUM & MAG HYDROXIDE-SIMETH 200-200-20 MG/5ML PO SUSP: 30.0000 mL | ORAL | Status: DC | PRN

## 2020-09-16 MED ORDER — SIMETHICONE 80 MG PO CHEW: 80.0000 mg | CHEWABLE_TABLET | Freq: Four times a day (QID) | ORAL | Status: DC | PRN

## 2020-09-16 MED ORDER — IBUPROFEN 800 MG PO TABS: 800.0000 mg | ORAL_TABLET | Freq: Three times a day (TID) | ORAL | Status: DC | PRN

## 2020-09-16 MED ORDER — LACTATED RINGERS IV SOLN: INTRAVENOUS | Status: AC

## 2020-09-16 MED ORDER — PRENATAL MULTIVITAMIN CH
1.0000 | ORAL_TABLET | Freq: Every day | ORAL | Status: DC
  Administered 2020-09-17: 1 via ORAL
  Filled 2020-09-16 (×2): qty 1

## 2020-09-16 MED ORDER — ONDANSETRON HCL 4 MG PO TABS: 4.0000 mg | ORAL_TABLET | Freq: Four times a day (QID) | ORAL | Status: DC | PRN

## 2020-09-16 MED ORDER — LACTATED RINGERS IV BOLUS
1000.0000 mL | Freq: Once | INTRAVENOUS | Status: AC
  Administered 2020-09-16: 1000 mL via INTRAVENOUS

## 2020-09-16 MED ORDER — METRONIDAZOLE 500 MG/100ML IV SOLN
500.0000 mg | Freq: Once | INTRAVENOUS | Status: AC
  Administered 2020-09-16: 500 mg via INTRAVENOUS
  Filled 2020-09-16: qty 100

## 2020-09-16 MED ORDER — OXYCODONE-ACETAMINOPHEN 5-325 MG PO TABS
1.0000 | ORAL_TABLET | ORAL | Status: DC | PRN
  Administered 2020-09-16 – 2020-09-19 (×7): 1 via ORAL
  Filled 2020-09-16 (×8): qty 1

## 2020-09-16 MED ORDER — ACETAMINOPHEN 325 MG PO TABS
650.0000 mg | ORAL_TABLET | Freq: Once | ORAL | Status: AC
  Administered 2020-09-16: 650 mg via ORAL
  Filled 2020-09-16: qty 2

## 2020-09-16 MED ORDER — SODIUM CHLORIDE 0.9 % IV SOLN
2.0000 g | Freq: Once | INTRAVENOUS | Status: AC
  Administered 2020-09-16: 2 g via INTRAVENOUS
  Filled 2020-09-16: qty 2

## 2020-09-16 MED ORDER — ENOXAPARIN SODIUM 40 MG/0.4ML IJ SOSY: 40.0000 mg | PREFILLED_SYRINGE | INTRAMUSCULAR | Status: DC

## 2020-09-16 MED ORDER — DOCUSATE SODIUM 100 MG PO CAPS
100.0000 mg | ORAL_CAPSULE | Freq: Two times a day (BID) | ORAL | Status: DC
  Administered 2020-09-17 – 2020-09-18 (×2): 100 mg via ORAL
  Filled 2020-09-16 (×2): qty 1

## 2020-09-16 MED ORDER — ONDANSETRON HCL 4 MG/2ML IJ SOLN
4.0000 mg | Freq: Four times a day (QID) | INTRAMUSCULAR | Status: DC | PRN
  Administered 2020-09-17 – 2020-09-19 (×3): 4 mg via INTRAVENOUS
  Filled 2020-09-16 (×3): qty 2

## 2020-09-16 MED ORDER — HYDROMORPHONE HCL 1 MG/ML IJ SOLN: 0.2000 mg | INTRAMUSCULAR | Status: DC | PRN

## 2020-09-16 MED ORDER — DEXTROSE IN LACTATED RINGERS 5 % IV SOLN: INTRAVENOUS | Status: DC

## 2020-09-16 MED ORDER — METRONIDAZOLE 500 MG/100ML IV SOLN
500.0000 mg | Freq: Four times a day (QID) | INTRAVENOUS | Status: DC
  Administered 2020-09-16 – 2020-09-17 (×4): 500 mg via INTRAVENOUS
  Filled 2020-09-16 (×4): qty 100

## 2020-09-16 MED ORDER — MORPHINE SULFATE (PF) 4 MG/ML IV SOLN
4.0000 mg | Freq: Once | INTRAVENOUS | Status: AC
  Administered 2020-09-16: 4 mg via INTRAVENOUS
  Filled 2020-09-16: qty 1

## 2020-09-16 MED ORDER — IOHEXOL 300 MG/ML  SOLN
100.0000 mL | Freq: Once | INTRAMUSCULAR | Status: AC | PRN
  Administered 2020-09-16: 100 mL via INTRAVENOUS
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
## 2020-09-16 NOTE — ED Triage Notes (Signed)
Pt sent by OBGYN for admission and CT scan. Pt had hysterectomy 7/13 and bladder surgery and has been having intense abd pain, OBGYN suspects an abd abscess. Pt has also had fever as high as 100.7 today.

## 2020-09-16 NOTE — ED Notes (Signed)
Attempted report x1, was asked to give floor RN approx 5-59min. Will call back approx [phone]

## 2020-09-16 NOTE — ED Provider Notes (Signed)
Emergency Medicine Provider Triage Evaluation Note  Jennifer Miranda , a 45 y.o. female  was evaluated in triage.  Pt is POD # 16 from transvaginal hysterectomy with anterior/cystocele repair. She presents with lower abdominal pain, nausea, and fever to 102.6 currently. No dysuria. No cva/flank pain. No cough or sob.   Review of Systems  Positive: Fever, abd pain Negative: Dysuria.   Physical Exam  BP 129/77 (BP Location: Left Arm)   Pulse (!) 121   Temp (!) 102.6 F (39.2 C)   Resp 20   LMP 08/20/2020   SpO2 100%  Gen:   Awake, no distress  febrile.  Resp:  Normal effort  MSK:   No swelling GU:  No cva tenderness. Abd:  Lower abd tenderness.   Medical Decision Making  Medically screening exam initiated at 3:23 PM.  Appropriate orders placed.  Jennifer Miranda was informed that the remainder of the evaluation will be completed by another provider, this initial triage assessment does not replace that evaluation, and the importance of remaining in the ED until their evaluation is complete.  IV LR bolus. Dilaudid for pain, zofran for nausea.   Labs, CT and IV abx ordered.   Dr Kittie Plater, pts ob/gyn called to inform of patient arrival and to request CT.  She requests call after CT resulted [phone]).     Cathren Laine, MD 09/16/20 [phone]
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
## 2020-09-16 NOTE — Progress Notes (Signed)
Admitted to room 6n13 via stretcher from ED. Pt ambulated from hallway to bathroom-gait steady.
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
## 2020-09-17 ENCOUNTER — Inpatient Hospital Stay (HOSPITAL_COMMUNITY): Payer: No Typology Code available for payment source

## 2020-09-17 LAB — CBC
HCT: 28.6 % — ABNORMAL LOW (ref 36.0–46.0)
Hemoglobin: 9.3 g/dL — ABNORMAL LOW (ref 12.0–15.0)
MCH: 28.8 pg (ref 26.0–34.0)
MCHC: 32.5 g/dL (ref 30.0–36.0)
MCV: 88.5 fL (ref 80.0–100.0)
Platelets: 296 10*3/uL (ref 150–400)
RBC: 3.23 MIL/uL — ABNORMAL LOW (ref 3.87–5.11)
RDW: 12.5 % (ref 11.5–15.5)
WBC: 18.1 10*3/uL — ABNORMAL HIGH (ref 4.0–10.5)
nRBC: 0 % (ref 0.0–0.2)

## 2020-09-17 LAB — COMPREHENSIVE METABOLIC PANEL
ALT: 12 U/L (ref 0–44)
AST: 12 U/L — ABNORMAL LOW (ref 15–41)
Albumin: 2.4 g/dL — ABNORMAL LOW (ref 3.5–5.0)
Alkaline Phosphatase: 60 U/L (ref 38–126)
Anion gap: 5 (ref 5–15)
BUN: 8 mg/dL (ref 6–20)
CO2: 25 mmol/L (ref 22–32)
Calcium: 7.9 mg/dL — ABNORMAL LOW (ref 8.9–10.3)
Chloride: 104 mmol/L (ref 98–111)
Creatinine, Ser: 0.67 mg/dL (ref 0.44–1.00)
GFR, Estimated: >60 mL/min (ref >60)
Glucose, Bld: 130 mg/dL — ABNORMAL HIGH (ref 70–99)
Potassium: 2.7 mmol/L — CL (ref 3.5–5.1)
Sodium: 134 mmol/L — ABNORMAL LOW (ref 135–145)
Total Bilirubin: 0.3 mg/dL (ref 0.3–1.2)
Total Protein: 5.2 g/dL — ABNORMAL LOW (ref 6.5–8.1)

## 2020-09-17 MED ORDER — NALOXONE HCL 0.4 MG/ML IJ SOLN
INTRAMUSCULAR | Status: AC
  Filled 2020-09-17: qty 1

## 2020-09-17 MED ORDER — KCL-LACTATED RINGERS-D5W 20 MEQ/L IV SOLN
INTRAVENOUS | Status: DC
  Filled 2020-09-17 (×3): qty 1000

## 2020-09-17 MED ORDER — ONDANSETRON HCL 4 MG/2ML IJ SOLN
INTRAMUSCULAR | Status: AC
  Filled 2020-09-17: qty 2

## 2020-09-17 MED ORDER — METRONIDAZOLE 500 MG/100ML IV SOLN
500.0000 mg | Freq: Three times a day (TID) | INTRAVENOUS | Status: DC
  Administered 2020-09-17 – 2020-09-19 (×5): 500 mg via INTRAVENOUS
  Filled 2020-09-17 (×5): qty 100

## 2020-09-17 MED ORDER — ONDANSETRON HCL 4 MG/2ML IJ SOLN
INTRAMUSCULAR | Status: AC | PRN
Start: 2020-09-17 — End: 2020-09-17
  Administered 2020-09-17: 4 mg via INTRAVENOUS

## 2020-09-17 MED ORDER — MIDAZOLAM HCL 2 MG/2ML IJ SOLN
INTRAMUSCULAR | Status: AC
  Filled 2020-09-17: qty 4

## 2020-09-17 MED ORDER — FENTANYL CITRATE (PF) 100 MCG/2ML IJ SOLN
INTRAMUSCULAR | Status: AC
  Filled 2020-09-17: qty 4

## 2020-09-17 MED ORDER — FLUMAZENIL 0.5 MG/5ML IV SOLN
INTRAVENOUS | Status: AC
  Filled 2020-09-17: qty 5

## 2020-09-17 MED ORDER — LIDOCAINE HCL (PF) 1 % IJ SOLN
INTRAMUSCULAR | Status: AC | PRN
  Administered 2020-09-17: 10 mL via INTRADERMAL

## 2020-09-17 MED ORDER — FENTANYL CITRATE (PF) 100 MCG/2ML IJ SOLN
INTRAMUSCULAR | Status: AC | PRN
  Administered 2020-09-17 (×2): 50 ug via INTRAVENOUS

## 2020-09-17 MED ORDER — MIDAZOLAM HCL 2 MG/2ML IJ SOLN
INTRAMUSCULAR | Status: AC | PRN
  Administered 2020-09-17 (×2): 1 mg via INTRAVENOUS

## 2020-09-17 MED ORDER — FERROUS SULFATE 325 (65 FE) MG PO TABS
325.0000 mg | ORAL_TABLET | Freq: Every day | ORAL | Status: DC
  Administered 2020-09-18 – 2020-09-19 (×2): 325 mg via ORAL
  Filled 2020-09-17 (×2): qty 1

## 2020-09-17 MED ORDER — LIDOCAINE HCL 1 % IJ SOLN
INTRAMUSCULAR | Status: AC
  Filled 2020-09-17: qty 10

## 2020-09-17 MED ORDER — SODIUM CHLORIDE 0.9 % IV SOLN
2.0000 g | INTRAVENOUS | Status: DC
  Administered 2020-09-17 – 2020-09-18 (×2): 2 g via INTRAVENOUS
  Filled 2020-09-17: qty 20
  Filled 2020-09-17: qty 2
  Filled 2020-09-17: qty 20

## 2020-09-17 NOTE — Progress Notes (Signed)
Dr. Henderson Cloud notified of critical K of 2.7

## 2020-09-17 NOTE — Sedation Documentation (Addendum)
Site assessed. Band-Aid CDI. Patient alert and without complaints. Transport arrived.

## 2020-09-17 NOTE — Progress Notes (Signed)
Patient is eating, ambulating, and voiding well.  Pain control is good.  Vitals:   09/16/20 2145 09/16/20 2220 09/17/20 0034 09/17/20 0557  BP:  116/69 (!) 99/56 92/64  Pulse:  98 84 86  Resp:  18 17 17   Temp: 98.6 F (37 C) 99.7 F (37.6 C) 98.7 F (37.1 C) 99.5 F (37.5 C)  TempSrc: Oral Oral Oral Oral  SpO2:  100% 96% (!) 89%    lungs:   clear to auscultation cor:    RRR Abdomen:  soft, appropriate tenderness, incisions intact and without erythema or exudate. ex:    no cords   Lab Results  Component Value Date   WBC 18.1 (H) 09/17/2020   HGB 9.3 (L) 09/17/2020   HCT 28.6 (L) 09/17/2020   MCV 88.5 09/17/2020   PLT 296 09/17/2020    A/P  Pelvic abscess PO#17 from TVH/TVT. Low potassium- K+ added to fluid, will recheck tomorrow. Mild anemia now pt has been hydrated- watch for now, iron added. Pelvic abscess- pt responding to antibiotics and afeb.  Ordering interventional radiology consul today for possible drainage.    Dr. 09/19/2020 to assume care today of pt.

## 2020-09-17 NOTE — Procedures (Signed)
Interventional Radiology Procedure Note  Procedure: CT ASPIRATION PELVIC ABSCESS    Complications: None  Estimated Blood Loss:  NONE  Findings: 50CC PUS ASPIRATED.  ABSCESS COMPLETELY DRAINED.  CX Lorna Few, MD
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
## 2020-09-18 LAB — CBC
HCT: 29.9 % — ABNORMAL LOW (ref 36.0–46.0)
HCT: 30.7 % — ABNORMAL LOW (ref 36.0–46.0)
Hemoglobin: 9.7 g/dL — ABNORMAL LOW (ref 12.0–15.0)
Hemoglobin: 10.2 g/dL — ABNORMAL LOW (ref 12.0–15.0)
MCH: 28.9 pg (ref 26.0–34.0)
MCH: 29.5 pg (ref 26.0–34.0)
MCHC: 32.4 g/dL (ref 30.0–36.0)
MCHC: 33.2 g/dL (ref 30.0–36.0)
MCV: 89 fL (ref 80.0–100.0)
MCV: 88.7 fL (ref 80.0–100.0)
Platelets: 337 10*3/uL (ref 150–400)
Platelets: 357 10*3/uL (ref 150–400)
RBC: 3.36 MIL/uL — ABNORMAL LOW (ref 3.87–5.11)
RBC: 3.46 MIL/uL — ABNORMAL LOW (ref 3.87–5.11)
RDW: 12.7 % (ref 11.5–15.5)
RDW: 12.8 % (ref 11.5–15.5)
WBC: 13.1 10*3/uL — ABNORMAL HIGH (ref 4.0–10.5)
WBC: 13.3 10*3/uL — ABNORMAL HIGH (ref 4.0–10.5)
nRBC: 0 % (ref 0.0–0.2)
nRBC: 0 % (ref 0.0–0.2)

## 2020-09-18 LAB — BASIC METABOLIC PANEL
Anion gap: 7 (ref 5–15)
Anion gap: 6 (ref 5–15)
BUN: 7 mg/dL (ref 6–20)
BUN: 5 mg/dL — ABNORMAL LOW (ref 6–20)
CO2: 27 mmol/L (ref 22–32)
CO2: 27 mmol/L (ref 22–32)
Calcium: 8 mg/dL — ABNORMAL LOW (ref 8.9–10.3)
Calcium: 8.4 mg/dL — ABNORMAL LOW (ref 8.9–10.3)
Chloride: 103 mmol/L (ref 98–111)
Chloride: 105 mmol/L (ref 98–111)
Creatinine, Ser: 0.65 mg/dL (ref 0.44–1.00)
Creatinine, Ser: 0.7 mg/dL (ref 0.44–1.00)
GFR, Estimated: >60 mL/min (ref >60)
GFR, Estimated: >60 mL/min (ref >60)
Glucose, Bld: 114 mg/dL — ABNORMAL HIGH (ref 70–99)
Glucose, Bld: 114 mg/dL — ABNORMAL HIGH (ref 70–99)
Potassium: 3.6 mmol/L (ref 3.5–5.1)
Potassium: 3.6 mmol/L (ref 3.5–5.1)
Sodium: 137 mmol/L (ref 135–145)
Sodium: 138 mmol/L (ref 135–145)

## 2020-09-18 MED ORDER — SENNA 8.6 MG PO TABS
1.0000 | ORAL_TABLET | Freq: Every day | ORAL | Status: DC | PRN
  Administered 2020-09-18 – 2020-09-19 (×2): 8.6 mg via ORAL
  Filled 2020-09-18 (×2): qty 1

## 2020-09-18 MED ORDER — CALCIUM CARBONATE ANTACID 500 MG PO CHEW
1.0000 | CHEWABLE_TABLET | Freq: Four times a day (QID) | ORAL | Status: DC | PRN
  Filled 2020-09-18: qty 1

## 2020-09-18 NOTE — Progress Notes (Signed)
Subjective: Reports feeling better, no fever or chills. Continues to have some vaginal discharge. Tolerating PO. Occasional nausea, improves with zofran. No emesis. Passing flatus. No BM since Friday, reports she is having some abdominal/pelvic discomfort from not having a BM. Voiding spontaneously without issues  Objective: Vital signs in last 24 hours: Temp:  [98.3 F (36.8 C)-100.9 F (38.3 C)] 98.3 F (36.8 C) (07/31 0525) Pulse Rate:  [76-97] 76 (07/31 0525) Resp:  [16-23] 18 (07/31 0525) BP: (93-105)/(53-69) 98/58 (07/31 0525) SpO2:  [94 %-100 %] 97 % (07/31 0524) Weight change:  Last BM Date: 09/17/20   General appearance: alert and no distress Resp: clear to auscultation bilaterally Cardio: RRR GI: soft, non-tender; bowel sounds normal; no masses,  no organomegaly Extremities: trace edema  Lab Results: Recent Labs    09/17/20 0136 09/18/20 0101  WBC 18.1* 13.1*  HGB 9.3* 9.7*  HCT 28.6* 29.9*  PLT 296 337   BMET Recent Labs    09/17/20 0136 09/18/20 0101  NA 134* 137  K 2.7* 3.6  CL 104 103  CO2 25 27  GLUCOSE 130* 114*  BUN 8 7  CREATININE 0.67 0.65  CALCIUM 7.9* 8.0*    Studies/Results: CT Abdomen Pelvis W Contrast  Result Date: 09/16/2020 CLINICAL DATA:  Abdominal pain, fever. Two weeks postop hysterectomy. EXAM: CT ABDOMEN AND PELVIS WITH CONTRAST TECHNIQUE: Multidetector CT imaging of the abdomen and pelvis was performed using the standard protocol following bolus administration of intravenous contrast. CONTRAST:  OMNIPAQUE IOHEXOL 300 MG/ML  SOLN COMPARISON:  None. FINDINGS: Lower chest: Lung bases are clear. No effusions. Heart is normal size. Hepatobiliary: No focal hepatic abnormality. Gallbladder unremarkable. Pancreas: No focal abnormality or ductal dilatation. Spleen: No focal abnormality.  Normal size. Adrenals/Urinary Tract: No adrenal abnormality. No focal renal abnormality. No stones or hydronephrosis. Urinary bladder is unremarkable.  Stomach/Bowel: Extensive inflammatory process noted in the pelvis surrounding the sigmoid colon. Few scattered diverticula. No evidence of bowel obstruction. Vascular/Lymphatic: No evidence of aneurysm or adenopathy. Reproductive: Prior hysterectomy. Low-density areas within both ovaries most likely reflect small follicles. Other: Small amount of free fluid in the pelvis. No free air. Extensive inflammation in the pelvis. There is a fluid collection adjacent to the mid sigmoid colon measuring 6.4 x 4.3 x 3.2 cm compatible with abscess. Musculoskeletal: no acute bony abnormality. IMPRESSION: Extensive inflammatory process in the pelvis, most likely postoperative infection. There is a fluid collection in the central pelvis adjacent to the sigmoid colon measuring 6.4 x 4.3 x 3.2 cm compatible with abscess. Few scattered diverticula sigmoid colon. However, given the extensive nature of the inflammatory process in the pelvis, this is not felt to be related to diverticulitis, but most likely postoperative. Electronically Signed   By: Charlett Nose M.D.   On: 09/16/2020 18:34   CT ASPIRATION  Result Date: 09/17/2020 INDICATION: Postop pelvic abscess, recent hysterectomy EXAM: CT-GUIDED ANTERIOR CENTRAL PELVIC ABSCESS NEEDLE ASPIRATION MEDICATIONS: The patient is currently admitted to the hospital and receiving intravenous antibiotics. The antibiotics were administered within an appropriate time frame prior to the initiation of the procedure. ANESTHESIA/SEDATION: Fentanyl 100 mcg IV; Versed 2.0 mg IV Moderate Sedation Time:  19 minutes The patient was continuously monitored during the procedure by the interventional radiology nurse under my direct supervision. COMPLICATIONS: None immediate. PROCEDURE: Informed written consent was obtained from the patient after a thorough discussion of the procedural risks, benefits and alternatives. All questions were addressed. Maximal Sterile Barrier Technique was utilized including  caps, mask,  sterile gowns, sterile gloves, sterile drape, hand hygiene and skin antiseptic. A timeout was performed prior to the initiation of the procedure. Previous imaging reviewed. Patient positioned supine. Noncontrast localization CT performed. The central pelvic fluid collection was localized and marked. This was correlated with the CT scan from yesterday. An anterior left lower quadrant access window is present for needle aspiration. Under sterile conditions and local anesthesia, an 18 gauge 15 cm access was advanced under intermittent CT guidance to the central pelvic fluid collection. Syringe aspiration yielded a total of 50 cc purulent fluid. Sample sent for culture. Postprocedure imaging demonstrates complete collapse of the central abscess following needle aspiration. Needle removed. Patient tolerated the procedure well. No immediate complication. IMPRESSION: Successful CT-guided needle aspiration of the central pelvic abscess. Sample sent for culture. Electronically Signed   By: Judie Petit.  Shick M.D.   On: 09/17/2020 11:31   DG Chest Port 1 View  Result Date: 09/16/2020 CLINICAL DATA:  Questionable sepsis EXAM: PORTABLE CHEST 1 VIEW COMPARISON:  09/06/2020 FINDINGS: The heart size and mediastinal contours are within normal limits. Both lungs are clear. The visualized skeletal structures are unremarkable. IMPRESSION: Normal study. Electronically Signed   By: Charlett Nose M.D.   On: 09/16/2020 17:53    Medications: Scheduled:  docusate sodium  100 mg Oral BID   enoxaparin (LOVENOX) injection  40 mg Subcutaneous Q24H   ferrous sulfate  325 mg Oral Q breakfast   prenatal multivitamin  1 tablet Oral Q1200   Continuous:  cefTRIAXone (ROCEPHIN)  IV Stopped (09/17/20 2242)   dextrose 5% lactated ringers 125 mL/hr at 09/16/20 2340   dextrose 5% lactated ringers with KCl 20 mEq/L Stopped (09/18/20 0606)   metronidazole 100 mL/hr at 09/18/20 0700   IWO:EHOZ & mag hydroxide-simeth, HYDROmorphone  (DILAUDID) injection, ibuprofen, ondansetron **OR** ondansetron (ZOFRAN) IV, oxyCODONE-acetaminophen, simethicone  Assessment/Plan: Jennifer Miranda 45 y.o. POD#18 s/p TVH/TVT, HD#3 admitted for fever, pelvic abscess. Pelvic abscess: in ED started cefepime, flagyl q6h, now on ceftriaxone and flagyl q8h. S/p aspiration by IR 8/1, aspirated completely, no drain left in place. Last fever 100.9, 7/30 11am. If remains afebrile, plan for 48h IV antibiotics and then transition to PO antibiotics to complete a 14 day course. Likely plan for flagyl 500mg  BID and Bactrim DS BID, pending cultures.  -Follow up blood, pelvic cultures inpatient and vaginal [facility] 7/29 -Cancelled c diff order since patient not having diarrhea -WBC improving, repeat in AM Electrolytes: s/p IV K, now normal. Repeat in AM Pain: motrin, percocet, dilaudid prn.  GI: regular diet, continue zofran prn, colace, added senna prn Prophylaxis: lovenox inpatient Dispo: if remains afebrile, anticipate discharge 8/1 afternoon  LOS: 2 days   Davaris Youtsey K Taam-Akelman 09/18/2020, 8:56 AM

## 2020-09-18 NOTE — Plan of Care (Signed)

## 2020-09-19 LAB — BASIC METABOLIC PANEL
Anion gap: 6 (ref 5–15)
BUN: 5 mg/dL — ABNORMAL LOW (ref 6–20)
CO2: 25 mmol/L (ref 22–32)
Calcium: 8.3 mg/dL — ABNORMAL LOW (ref 8.9–10.3)
Chloride: 106 mmol/L (ref 98–111)
Creatinine, Ser: 0.58 mg/dL (ref 0.44–1.00)
GFR, Estimated: >60 mL/min (ref >60)
Glucose, Bld: 108 mg/dL — ABNORMAL HIGH (ref 70–99)
Potassium: 3.6 mmol/L (ref 3.5–5.1)
Sodium: 137 mmol/L (ref 135–145)

## 2020-09-19 LAB — CBC
HCT: 30 % — ABNORMAL LOW (ref 36.0–46.0)
Hemoglobin: 9.4 g/dL — ABNORMAL LOW (ref 12.0–15.0)
MCH: 28.4 pg (ref 26.0–34.0)
MCHC: 31.3 g/dL (ref 30.0–36.0)
MCV: 90.6 fL (ref 80.0–100.0)
Platelets: 353 10*3/uL (ref 150–400)
RBC: 3.31 MIL/uL — ABNORMAL LOW (ref 3.87–5.11)
RDW: 12.9 % (ref 11.5–15.5)
WBC: 11.5 10*3/uL — ABNORMAL HIGH (ref 4.0–10.5)
nRBC: 0 % (ref 0.0–0.2)

## 2020-09-19 MED ORDER — SULFAMETHOXAZOLE-TRIMETHOPRIM 800-160 MG PO TABS
1.0000 | ORAL_TABLET | Freq: Two times a day (BID) | ORAL | Status: DC
  Administered 2020-09-19: 1 via ORAL
  Filled 2020-09-19: qty 1

## 2020-09-19 MED ORDER — BISACODYL 10 MG RE SUPP: 10.0000 mg | Freq: Every day | RECTAL | Status: DC | PRN

## 2020-09-19 MED ORDER — METRONIDAZOLE 500 MG PO TABS: 500.0000 mg | ORAL_TABLET | Freq: Two times a day (BID) | ORAL | 0 refills | Status: AC

## 2020-09-19 MED ORDER — ACETAMINOPHEN 325 MG PO TABS
650.0000 mg | ORAL_TABLET | Freq: Four times a day (QID) | ORAL | Status: DC | PRN
  Administered 2020-09-19: 650 mg via ORAL
  Filled 2020-09-19: qty 2

## 2020-09-19 MED ORDER — ONDANSETRON HCL 4 MG PO TABS: 4.0000 mg | ORAL_TABLET | Freq: Four times a day (QID) | ORAL | 0 refills | Status: AC | PRN

## 2020-09-19 MED ORDER — SULFAMETHOXAZOLE-TRIMETHOPRIM 800-160 MG PO TABS: 1.0000 | ORAL_TABLET | Freq: Two times a day (BID) | ORAL | 0 refills | Status: AC

## 2020-09-19 MED ORDER — POLYETHYLENE GLYCOL 3350 17 G PO PACK
17.0000 g | PACK | Freq: Two times a day (BID) | ORAL | Status: DC
  Filled 2020-09-19: qty 1

## 2020-09-19 MED ORDER — METRONIDAZOLE 500 MG PO TABS
500.0000 mg | ORAL_TABLET | Freq: Two times a day (BID) | ORAL | Status: DC
  Administered 2020-09-19: 500 mg via ORAL
  Filled 2020-09-19: qty 1

## 2020-09-19 NOTE — Progress Notes (Signed)
Subjective: Patient seen and examined.  Afebrile since 7/30.  Denies subjective feves / chills.  Overall feeling much better.  She report no vomiting, but occasional nausea.  Is tolerating PO, though appetite is still less than normal.  Is ambulating in room, + flatus.  Vaginal discharge is same or less in quantity than before.  No BM since Friday, reports she is having some abdominal/pelvic discomfort from not having a BM. Voiding spontaneously without issues.   Objective: Vital signs in last 24 hours: Temp:  [98.3 F (36.8 C)-98.7 F (37.1 C)] 98.3 F (36.8 C) (08/01 0753) Pulse Rate:  [68-82] 68 (08/01 0753) Resp:  [16-18] 16 (08/01 0753) BP: (103-139)/(63-75) 116/75 (08/01 0753) SpO2:  [97 %-100 %] 99 % (08/01 0753) Weight change:  Last BM Date: 09/17/20   General appearance: alert and no distress Resp: Normal RR GI: normal findings: soft, non-tender and No tenderness to deep palpation in lower pelvis Extremities: trace edema  Lab Results: Recent Labs    09/18/20 1020 09/19/20 0622  WBC 13.3* 11.5*  HGB 10.2* 9.4*  HCT 30.7* 30.0*  PLT 357 353   BMET Recent Labs    09/18/20 1020 09/19/20 0622  NA 138 137  K 3.6 3.6  CL 105 106  CO2 27 25  GLUCOSE 114* 108*  BUN 5* 5*  CREATININE 0.70 0.58  CALCIUM 8.4* 8.3*    Studies/Results: CT ASPIRATION  Result Date: 09/17/2020 INDICATION: Postop pelvic abscess, recent hysterectomy EXAM: CT-GUIDED ANTERIOR CENTRAL PELVIC ABSCESS NEEDLE ASPIRATION MEDICATIONS: The patient is currently admitted to the hospital and receiving intravenous antibiotics. The antibiotics were administered within an appropriate time frame prior to the initiation of the procedure. ANESTHESIA/SEDATION: Fentanyl 100 mcg IV; Versed 2.0 mg IV Moderate Sedation Time:  19 minutes The patient was continuously monitored during the procedure by the interventional radiology nurse under my direct supervision. COMPLICATIONS: None immediate. PROCEDURE: Informed  written consent was obtained from the patient after a thorough discussion of the procedural risks, benefits and alternatives. All questions were addressed. Maximal Sterile Barrier Technique was utilized including caps, mask, sterile gowns, sterile gloves, sterile drape, hand hygiene and skin antiseptic. A timeout was performed prior to the initiation of the procedure. Previous imaging reviewed. Patient positioned supine. Noncontrast localization CT performed. The central pelvic fluid collection was localized and marked. This was correlated with the CT scan from yesterday. An anterior left lower quadrant access window is present for needle aspiration. Under sterile conditions and local anesthesia, an 18 gauge 15 cm access was advanced under intermittent CT guidance to the central pelvic fluid collection. Syringe aspiration yielded a total of 50 cc purulent fluid. Sample sent for culture. Postprocedure imaging demonstrates complete collapse of the central abscess following needle aspiration. Needle removed. Patient tolerated the procedure well. No immediate complication. IMPRESSION: Successful CT-guided needle aspiration of the central pelvic abscess. Sample sent for culture. Electronically Signed   By: Judie Petit.  Shick M.D.   On: 09/17/2020 11:31    Medications: Scheduled:  enoxaparin (LOVENOX) injection  40 mg Subcutaneous Q24H   polyethylene glycol  17 g Oral BID   Continuous:  cefTRIAXone (ROCEPHIN)  IV 2 g (09/18/20 2041)   metronidazole 500 mg (09/19/20 0517)   [facility]:ENIDPOEUMPNTI, alum & mag hydroxide-simeth, bisacodyl, calcium carbonate, HYDROmorphone (DILAUDID) injection, ibuprofen, ondansetron **OR** ondansetron (ZOFRAN) IV, oxyCODONE-acetaminophen, senna, simethicone  Assessment/Plan: Jennifer Miranda 45 y.o. POD#19 s/p TVH/TVT, HD#3 admitted for fever, pelvic abscess. Pelvic abscess: in ED started cefepime, flagyl q6h, now on ceftriaxone and  flagyl q8h. S/p aspiration by IR 8/1, aspirated completely, no  drain left in place. Last fever 100.9, 7/30 11am. If remains afebrile, plan for 48h IV antibiotics and then transition to PO antibiotics to complete a 14 day course. Likely plan for flagyl 500mg  BID and Bactrim DS BID, pending cultures.  Current result is moderate gram neg rods / rare gram positive cocci on gram stain, no growth at 1 day. -Follow up blood, pelvic cultures inpatient and vaginal cultures at [facility] 7/29 (also still pending) -WBC continues to improve Electrolytes: s/p IV K, now normal x 2 Pain: transition to motrin and acetaminophen  GI: regular diet, continued constipation per pt.  Will d/c PO iron, minimize zofran use.  Stop percocet unless severe pain.  Transition to BID miralax, senna daily.  Dulcolax suppository prn Prophylaxis: lovenox inpatient Dispo: if remains afebrile, anticipate discharge this PM vs tomorrow AM after transition to PO antibiotics as patient is concerned about intermittent LLQ pelvic cramping    LOS: 3 days   [facility] GEFFEL Cristoval Teall 09/19/2020, 10:34 AM

## 2020-09-19 NOTE — Discharge Summary (Signed)
Physician Discharge Summary  Patient ID: Jennifer Miranda MRN: [id number] DOB/AGE: 10/04/1975 45 y.o.  Admit date: 09/16/2020 Discharge date: 09/19/2020  Admission Diagnoses:  Discharge Diagnoses:  Active Problems:   Pelvic abscess in female   Discharged Condition: good  Hospital Course: 45 y.o. [id number] presented POD#16 from [facility], bilateral salpingectomy, TVT and cystoscopy with complaints of nausea, vomiting, pelvic pain and d/c from vagina. She was noted to have an elevated temperature of 100.2 F in the office.  She initially thought she was constipated.  C/o cramping, then nausea and shaking started.  No appetite.     She was sent to the ER where a CT abdomen / pelvis revealed a 6 x 4 x 3 cm fluid collection in the left pelvis consistent with a pelvic abscess.    She was admitted and blood cultures drawn.  She was started on broad spectrum antibiotics.  Interventional radiology was consulted and aspirated the fluid collection in its entirety.  The aspirate was sent for culture.    The patient remained afebrile for > 48 hours.  She was transitioned to PO bactrim and flagyl with plan for a 14 day course.  Her white count trended down for 21 to 11.  Her vital signs were otherwise normal.  On HD#3, her nausea had resolved.  She was tolerating PO without difficulty.  Pain was minimal, with the exception of mild cramping when she felt constipated.  She had a bowel movement and symptoms improved.  She was meeting all goals for discharge to home.   On day of discharge, blood cultures were negative at 3 days.  The culture from the pelvic abscess showed no growth at 2 days (though the gram stan showed few gram positive cocci and many gram negative rods)  Consults:  IR  Significant Diagnostic Studies: microbiology:  abscess culture  and radiology: CT scan: pelvic abscess as above  Treatments: antibiotics: ceftriaxone and metronidazole and procedures: IR guided aspiration of pelvic abscess  Discharge  Exam: Blood pressure 110/65, pulse 80, temperature 98.9 F (37.2 C), temperature source Oral, resp. rate 17, last menstrual period 08/20/2020, SpO2 100 %, unknown if currently breastfeeding. General appearance: alert, cooperative, and no distress GI: normal findings: soft, non-tender to deep palpation in all quadrants, no rebound or guarding Extremities: edema trace edema, symmetric  Disposition: Discharge disposition: 01-Home or Self Care       Discharge Instructions     Call MD for:  difficulty breathing, headache or visual disturbances   Complete by: As directed    Call MD for:  extreme fatigue   Complete by: As directed    Call MD for:  hives   Complete by: As directed    Call MD for:  persistant dizziness or light-headedness   Complete by: As directed    Call MD for:  persistant nausea and vomiting   Complete by: As directed    Call MD for:  redness, tenderness, or signs of infection (pain, swelling, redness, odor or green/yellow discharge around incision site)   Complete by: As directed    Call MD for:  severe uncontrolled pain   Complete by: As directed    Call MD for:  temperature >100.4   Complete by: As directed    Sexual activity   Complete by: As directed    Nothing in vagina x 6 weeks (no tampons, no sex)      Allergies as of 09/19/2020       Reactions   Penicillins Anaphylaxis,  Other (See Comments)   Has patient had a PCN reaction causing immediate rash, facial/tongue/throat swelling, SOB or lightheadedness with hypotension: Yes Has patient had a PCN reaction causing severe rash involving mucus membranes or skin necrosis: No Has patient had a PCN reaction that required hospitalization: Yes Has patient had a PCN reaction occurring within the last 10 years: No **cardiac arrest @ 45 years of age** If all of the above answers are "NO", then may proceed with Cephalosporin use.   Wound Dressing Adhesive Hives   Latex Hives, Rash        Medication List      STOP taking these medications    Advil PM 200-38 MG Tabs Generic drug: Ibuprofen-diphenhydrAMINE Cit   oxyCODONE-acetaminophen 5-325 MG tablet Commonly known as: PERCOCET/ROXICET       TAKE these medications    acetaminophen 500 MG tablet Commonly known as: TYLENOL Take 500-1,000 mg by mouth every 6 (six) hours as needed for mild pain or fever.   famotidine 20 MG tablet Commonly known as: PEPCID Take 20 mg by mouth daily before breakfast.   loratadine 10 MG tablet Commonly known as: CLARITIN Take 10 mg by mouth daily.   metroNIDAZOLE 500 MG tablet Commonly known as: FLAGYL Take 1 tablet (500 mg total) by mouth 2 (two) times daily.   ondansetron 4 MG tablet Commonly known as: ZOFRAN Take 1 tablet (4 mg total) by mouth every 6 (six) hours as needed for nausea.   sulfamethoxazole-trimethoprim 800-160 MG tablet Commonly known as: BACTRIM DS Take 1 tablet by mouth 2 (two) times daily.        Follow-up Information     Carrington Clamp, MD Follow up.   Specialty: Obstetrics and Gynecology Why: Please make a follow up visit with either Dr. Claudine Mouton or Dr. Chestine Spore at the end of this week.  If you continue to feel well, you may cancel and make an appointment to see Dr. Henderson Cloud next week Contact information: [address] [place] [address] [place] [address] [phone]                 Signed: Carmelina Dane GEFFEL Jonuel Butterfield 09/19/2020, 3:58 PM

## 2020-09-19 NOTE — Progress Notes (Signed)
Discharge instructions reviewed with the patient. Patient has no question at this time. Currently awaiting husband to pick her.

## 2020-09-19 NOTE — Progress Notes (Signed)
Spoke w charge nurse who stated patient had a BM this AM.  She was feeling better and desired discharge to home.  Discharge orders placed

## 2020-09-19 NOTE — Discharge Instructions (Signed)
You may take ibuprofen 600 to 800 mg every 6 hours as needed for cramping.  You may add acetaminophen 1000mg  every 6 hours as needed.    You have been provided a prescription for ondansetron for nausea as needed

## 2020-09-20 LAB — AEROBIC/ANAEROBIC CULTURE W GRAM STAIN (SURGICAL/DEEP WOUND)

## 2020-09-21 LAB — CULTURE, BLOOD (ROUTINE X 2)
Culture: NO GROWTH
Culture: NO GROWTH

## 2021-12-10 IMAGING — DX DG CHEST 2V
2 series · 2 of 2 positions shown · non-contrast
Comparison: None.

CLINICAL DATA: Chest pain

EXAM:
CHEST - 2 VIEW

[chest pa]
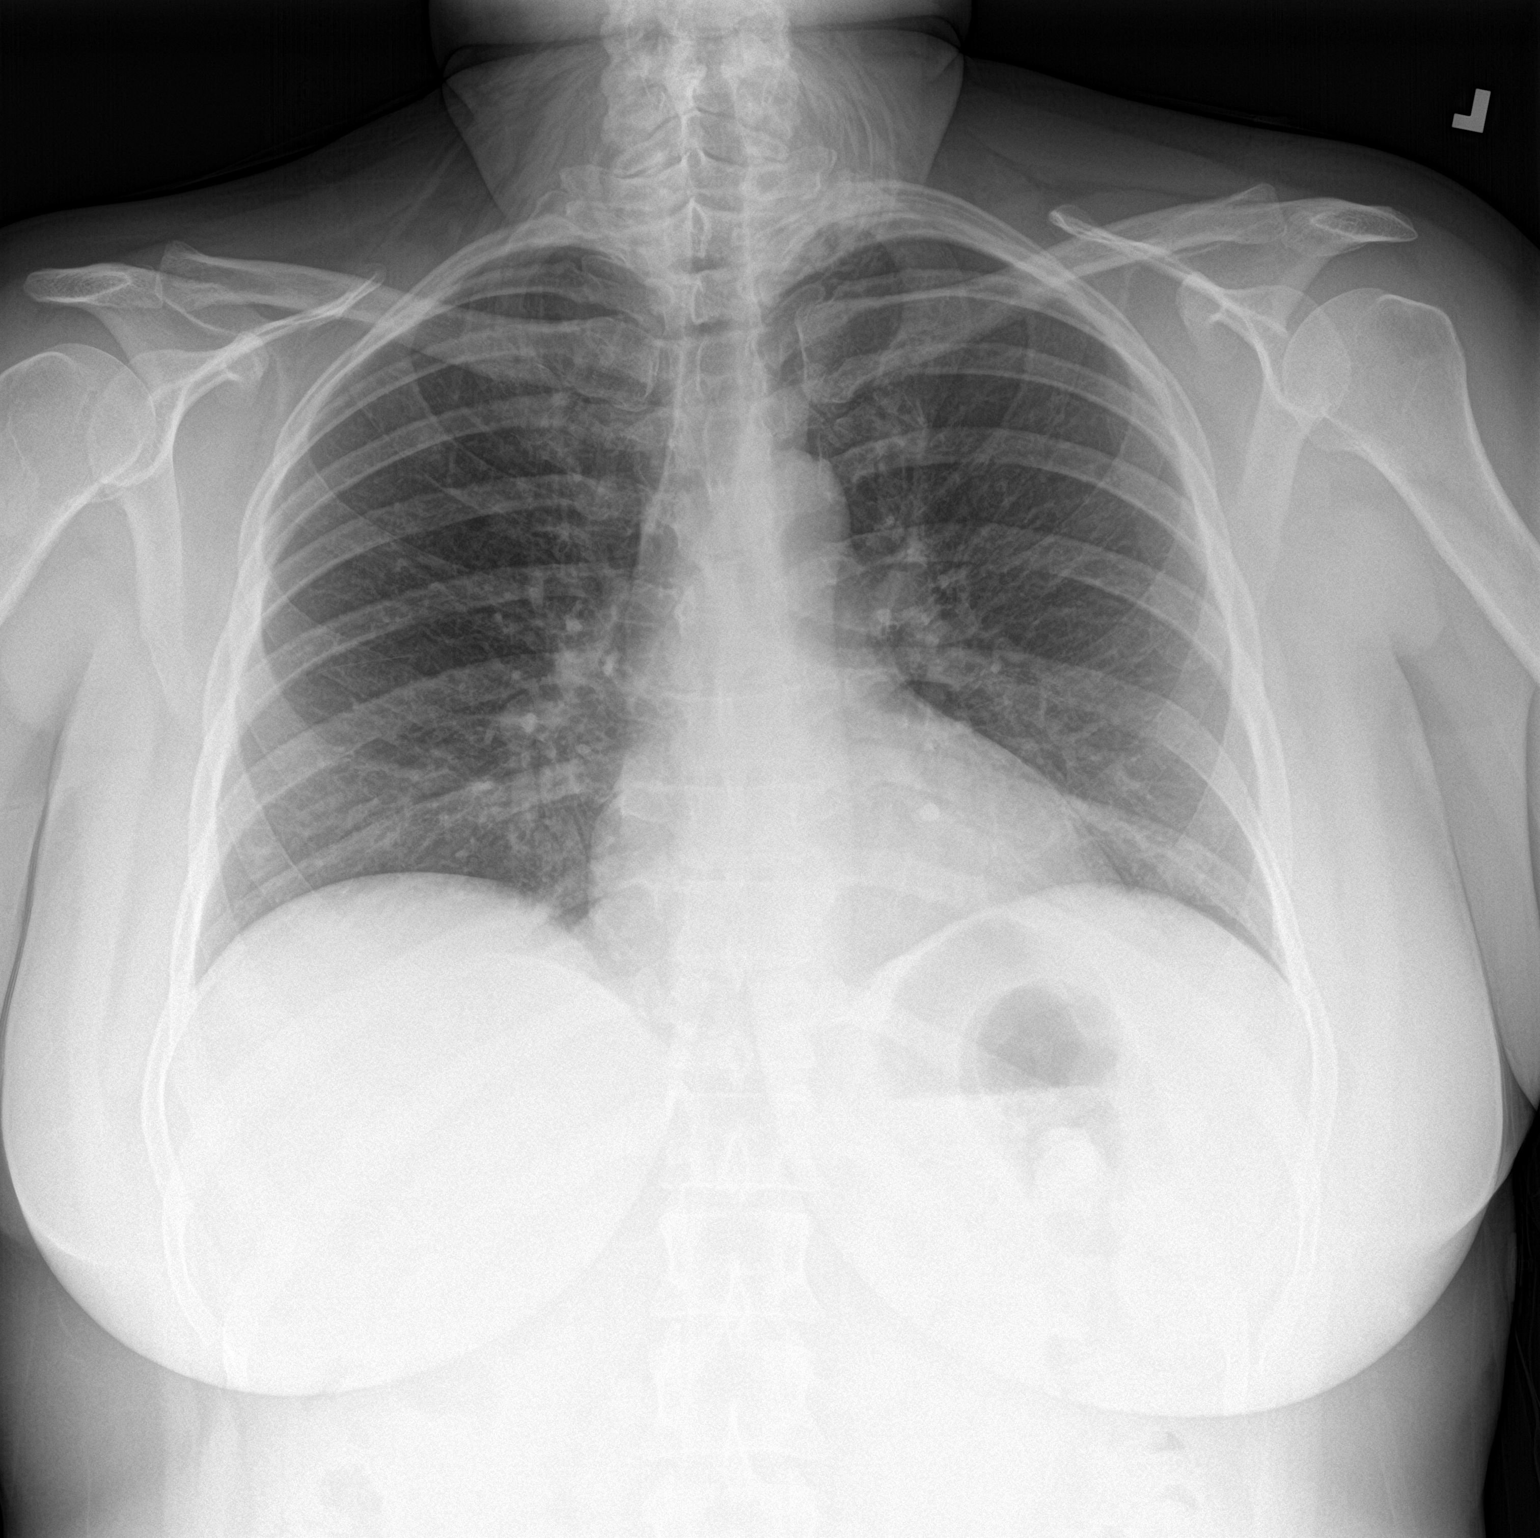

[chest lat]
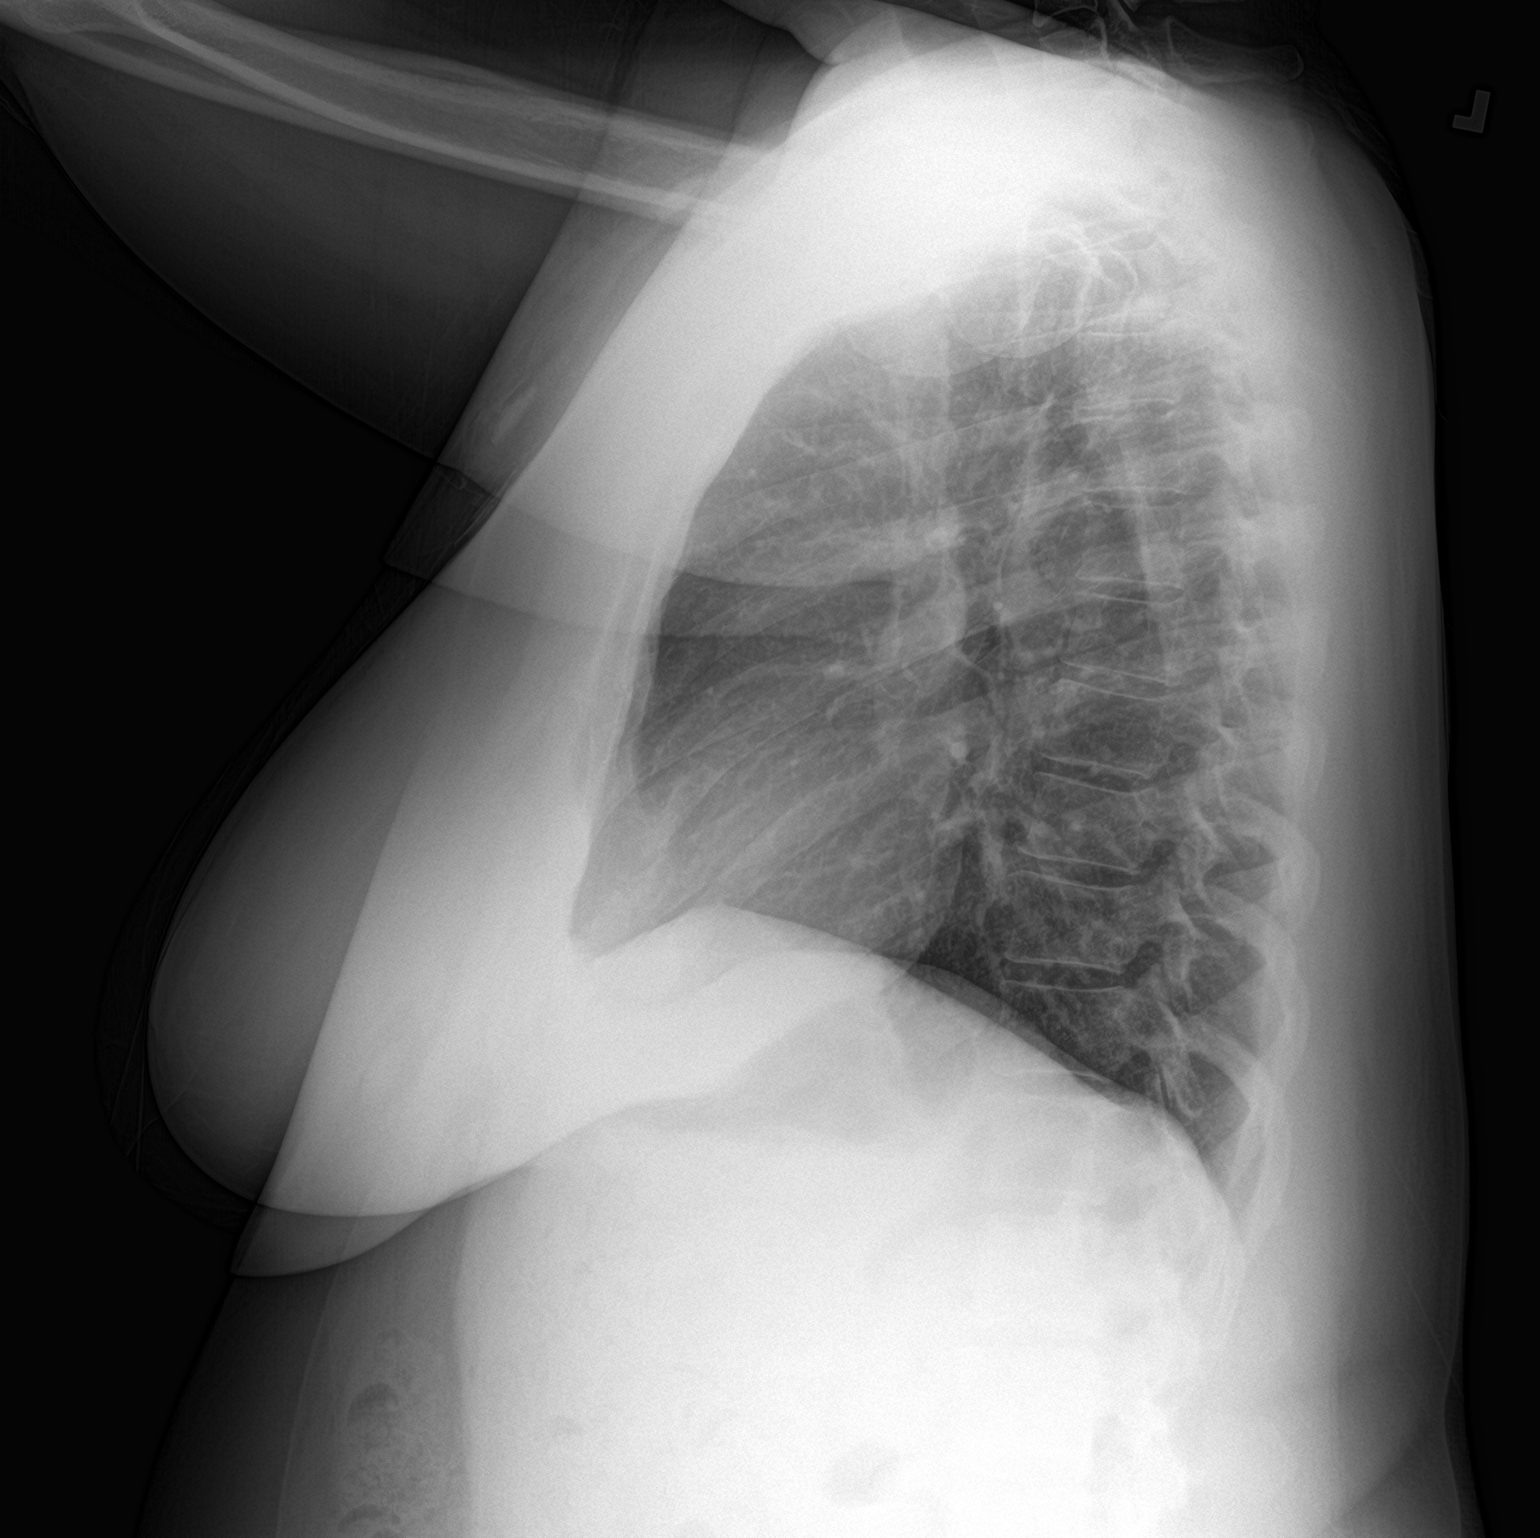

[2 of 2 positions shown; findings below may reference images not displayed]

FINDINGS: The heart size and mediastinal contours are within normal limits.

No focal consolidation. No pulmonary edema. No pleural effusion. No
pneumothorax.

No acute osseous abnormality.
IMPRESSION: No active cardiopulmonary disease.

## 2021-12-21 IMAGING — CT CT GUIDANCE NEEDLE PLACEMENT
1 of 3 series · 14 of 32 positions shown, 19 images · non-contrast
Comparison: none

INDICATION: Postop pelvic abscess, recent hysterectomy

[Series 2: i-spiral 5.0 b40f · axial · 0.98mm/px · z∈[+862,+1072]mm · 14 of 70 slices shown, 19 images]
[im 5/70  soft-tissue]
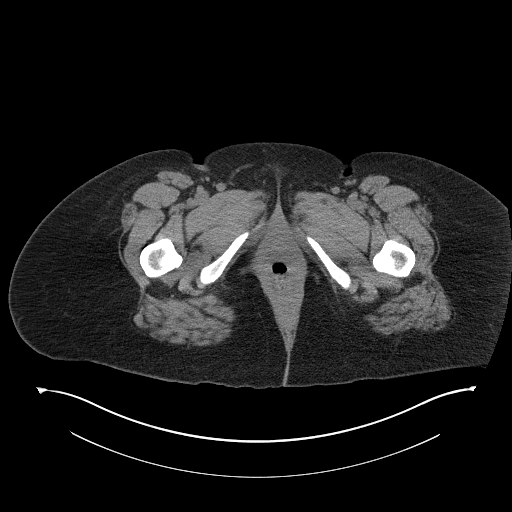
[im 5/70  bone]
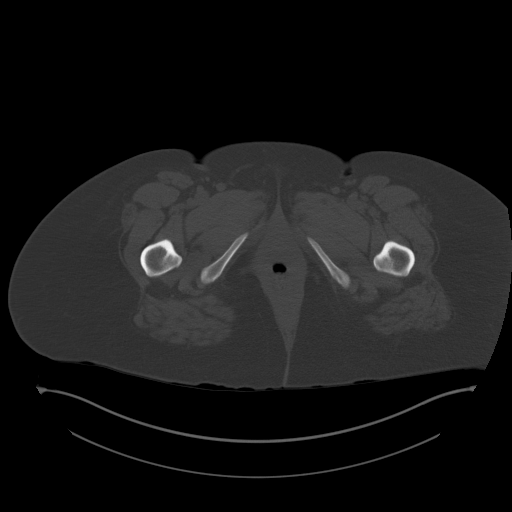
[im 9/70  soft-tissue]
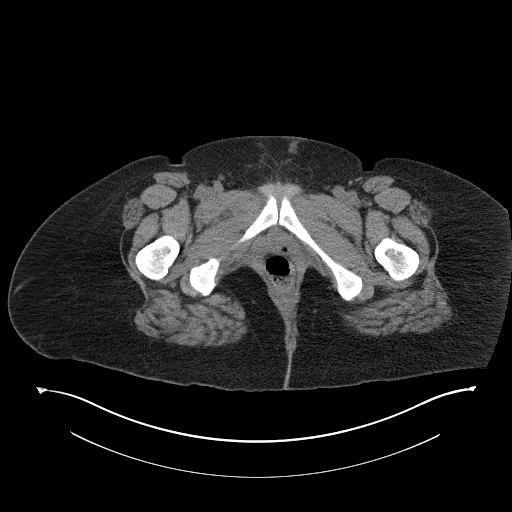
[im 13/70  soft-tissue]
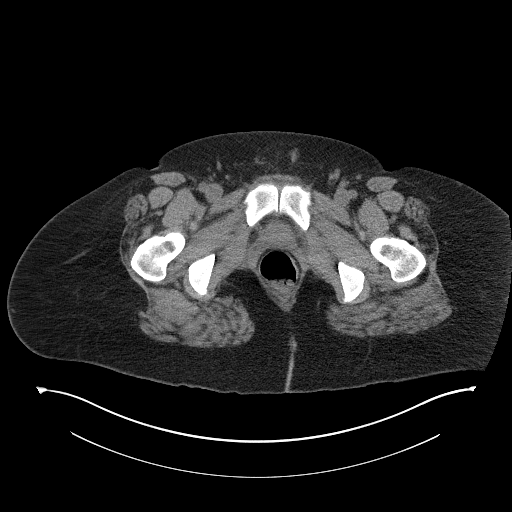
[im 22/70  soft-tissue]
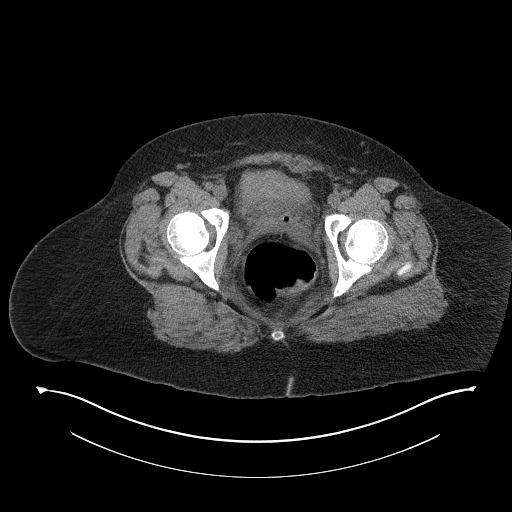
[im 26/70  soft-tissue]
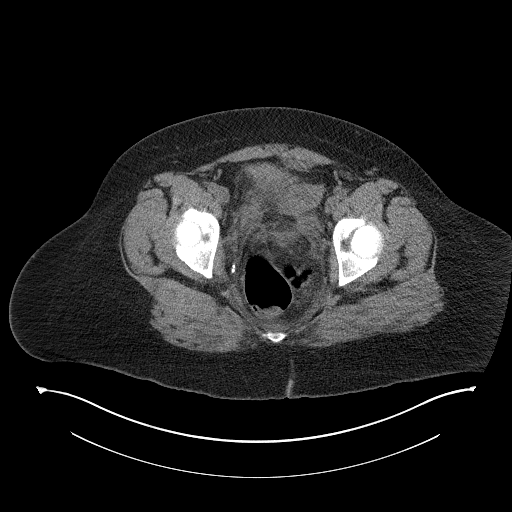
[im 31/70  soft-tissue]
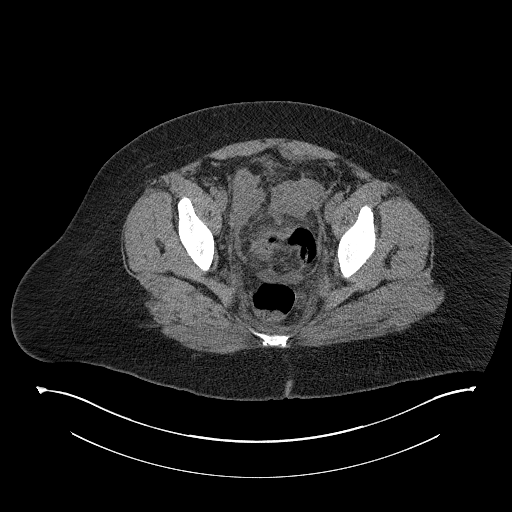
[im 35/70  soft-tissue]
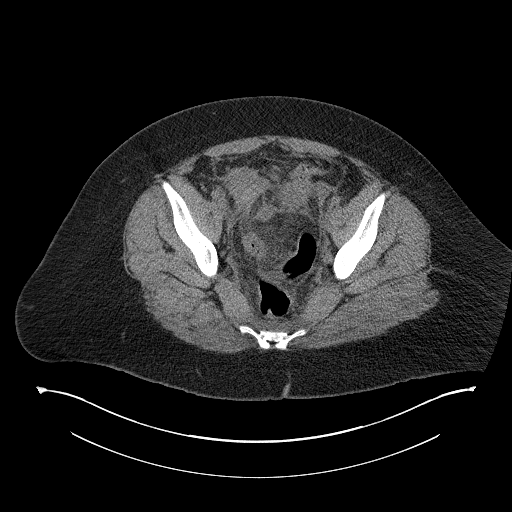
[im 39/70  soft-tissue]
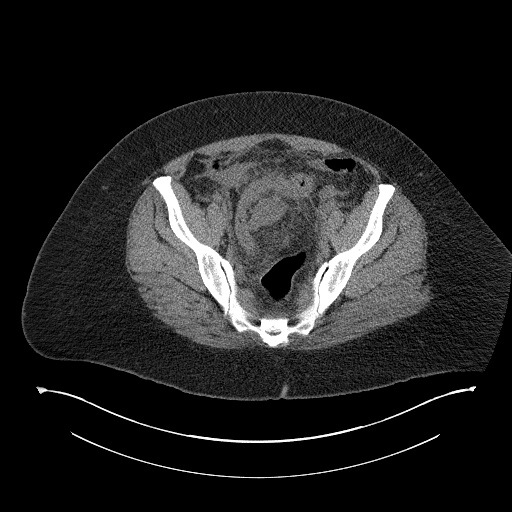
[im 44/70  soft-tissue]
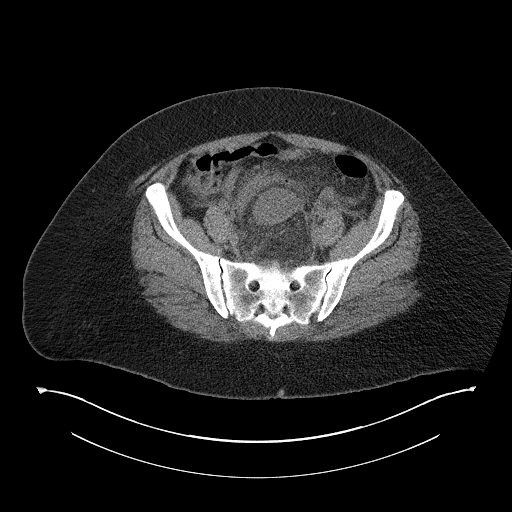
[im 44/70  bone]
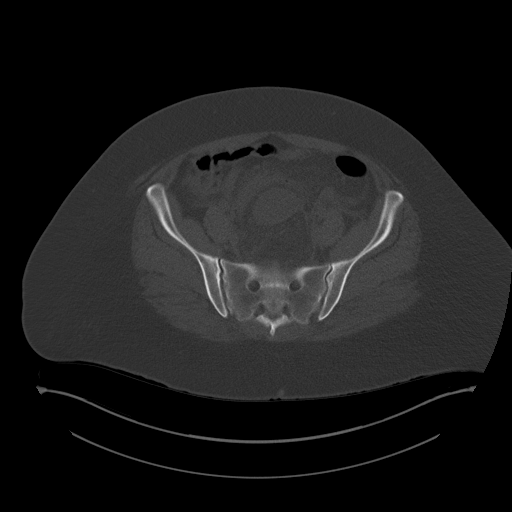
[im 48/70  soft-tissue]
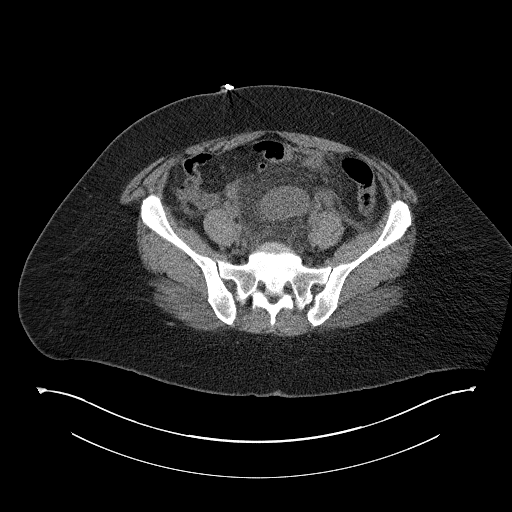
[im 52/70  lung]
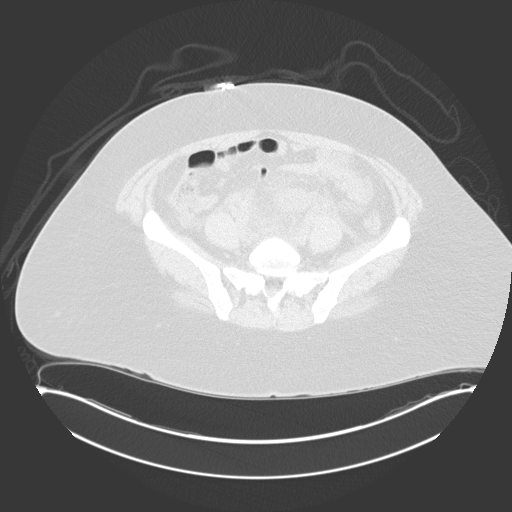
[im 57/70  soft-tissue]
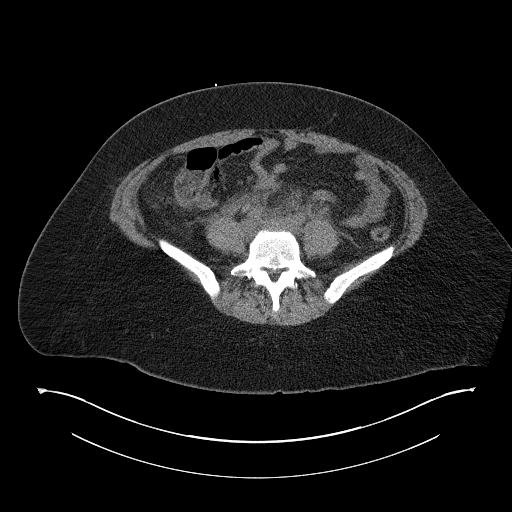
[im 57/70  lung]
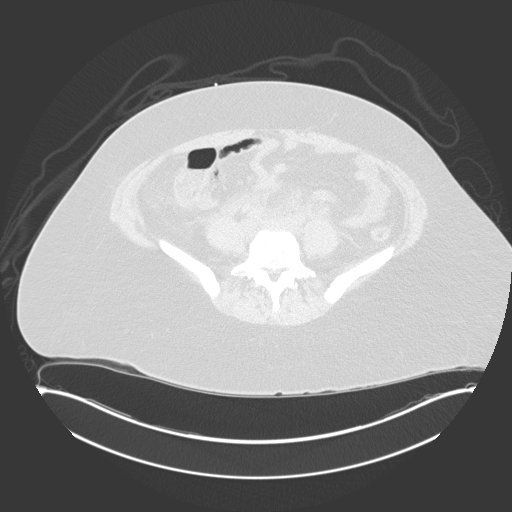
[im 61/70  soft-tissue]
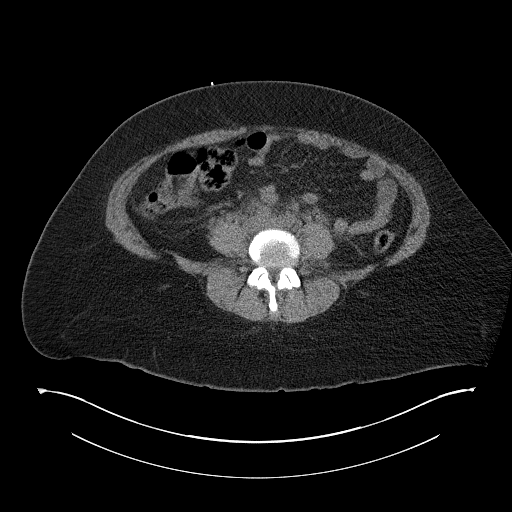
[im 61/70  lung]
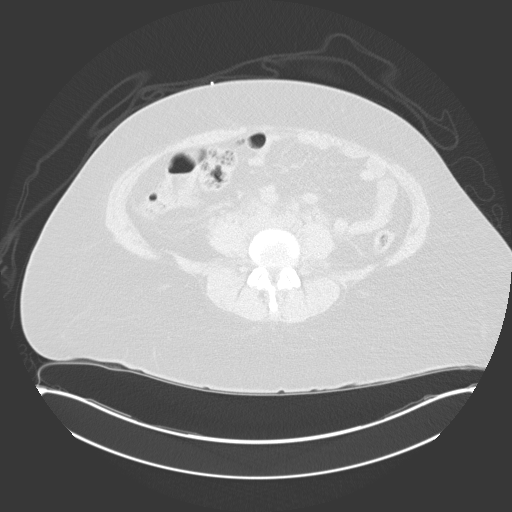
[im 65/70  soft-tissue]
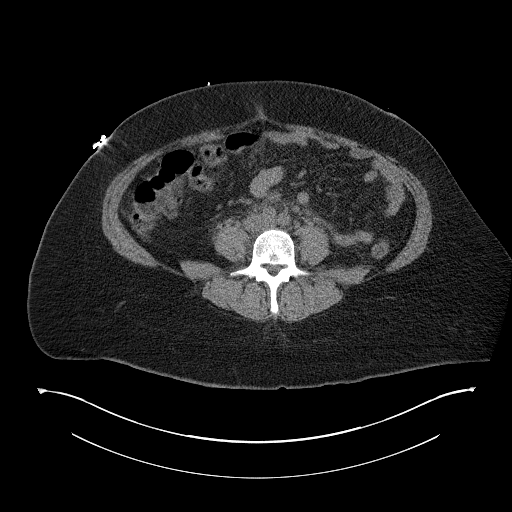
[im 65/70  lung]
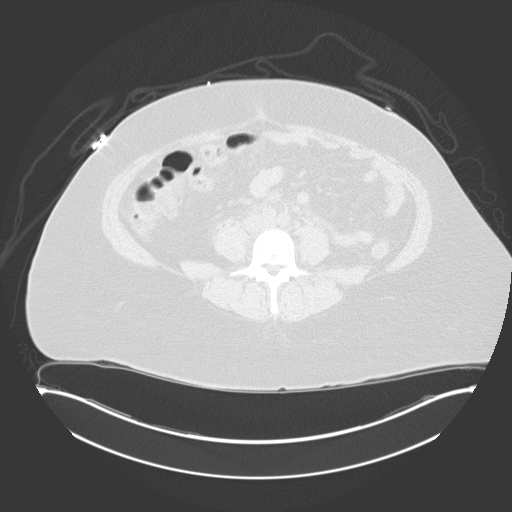

[14 of 32 positions shown; findings below may reference images not displayed]

EXAM:
CT-GUIDED ANTERIOR CENTRAL PELVIC ABSCESS NEEDLE ASPIRATION

MEDICATIONS:
The patient is currently admitted to the hospital and receiving
intravenous antibiotics. The antibiotics were administered within an
appropriate time frame prior to the initiation of the procedure.

ANESTHESIA/SEDATION:
Fentanyl 100 mcg IV; Versed 2.0 mg IV

Moderate Sedation Time:  19 minutes

The patient was continuously monitored during the procedure by the
interventional radiology nurse under my direct supervision.

COMPLICATIONS:
None immediate.

PROCEDURE:
Informed written consent was obtained from the patient after a
thorough discussion of the procedural risks, benefits and
alternatives. All questions were addressed. Maximal Sterile Barrier
Technique was utilized including caps, mask, sterile gowns, sterile
gloves, sterile drape, hand hygiene and skin antiseptic. A timeout
was performed prior to the initiation of the procedure.

Previous imaging reviewed. Patient positioned supine. Noncontrast
localization CT performed. The central pelvic fluid collection was
localized and marked. This was correlated with the CT scan from
yesterday. An anterior left lower quadrant access window is present
for needle aspiration.

Under sterile conditions and local anesthesia, an 18 gauge 15 cm
access was advanced under intermittent CT guidance to the central
pelvic fluid collection. Syringe aspiration yielded a total of 50 cc
purulent fluid. Sample sent for culture. Postprocedure imaging
demonstrates complete collapse of the central abscess following
needle aspiration.

Needle removed. Patient tolerated the procedure well. No immediate
complication.
IMPRESSION: Successful CT-guided needle aspiration of the central pelvic
abscess. Sample sent for culture.
